# Patient Record
Sex: Female | Born: 2000 | Hispanic: No | Marital: Single | Smoking: Never smoker
Health system: Southern US, Community
[De-identification: ages and names within clinical notes are randomized; demographics above are authoritative.]

## PROBLEM LIST (undated history)

## (undated) ENCOUNTER — Inpatient Hospital Stay (HOSPITAL_COMMUNITY): Payer: Self-pay

## (undated) DIAGNOSIS — S83519A Sprain of anterior cruciate ligament of unspecified knee, initial encounter: Secondary | ICD-10-CM

## (undated) DIAGNOSIS — S83289A Other tear of lateral meniscus, current injury, unspecified knee, initial encounter: Secondary | ICD-10-CM

## (undated) DIAGNOSIS — R0989 Other specified symptoms and signs involving the circulatory and respiratory systems: Secondary | ICD-10-CM

## (undated) DIAGNOSIS — S83249A Other tear of medial meniscus, current injury, unspecified knee, initial encounter: Secondary | ICD-10-CM

## (undated) DIAGNOSIS — J45909 Unspecified asthma, uncomplicated: Secondary | ICD-10-CM

## (undated) HISTORY — DX: Unspecified asthma, uncomplicated: J45.909

---
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
## 2018-01-10 ENCOUNTER — Emergency Department (HOSPITAL_COMMUNITY)
Admission: EM | Admit: 2018-01-10 | Discharge: 2018-01-10 | Discharge status: Against Medical Advice | Payer: Medicaid Other | Attending: Emergency Medicine | Admitting: Emergency Medicine

## 2018-01-10 ENCOUNTER — Encounter (HOSPITAL_COMMUNITY): Payer: Self-pay | Admitting: Emergency Medicine

## 2018-01-10 ENCOUNTER — Other Ambulatory Visit: Payer: Self-pay

## 2018-01-10 DIAGNOSIS — J45909 Unspecified asthma, uncomplicated: Secondary | ICD-10-CM | POA: Insufficient documentation

## 2018-01-10 DIAGNOSIS — Z79899 Other long term (current) drug therapy: Secondary | ICD-10-CM | POA: Insufficient documentation

## 2018-01-10 DIAGNOSIS — R1030 Lower abdominal pain, unspecified: Secondary | ICD-10-CM

## 2018-01-10 DIAGNOSIS — R1032 Left lower quadrant pain: Secondary | ICD-10-CM | POA: Insufficient documentation

## 2018-01-10 LAB — URINALYSIS, ROUTINE W REFLEX MICROSCOPIC
Bilirubin Urine: NEGATIVE
Glucose, UA: NEGATIVE mg/dL
Hgb urine dipstick: NEGATIVE
Ketones, ur: NEGATIVE mg/dL
Leukocytes, UA: NEGATIVE
Nitrite: NEGATIVE
Protein, ur: NEGATIVE mg/dL
Specific Gravity, Urine: 1.01 (ref 1.005–1.030)
pH: 8 (ref 5.0–8.0)

## 2018-01-10 LAB — CBC
HCT: 41.5 % (ref 36.0–46.0)
Hemoglobin: 13.8 g/dL (ref 12.0–15.0)
MCH: 28.2 pg (ref 26.0–34.0)
MCHC: 33.3 g/dL (ref 30.0–36.0)
MCV: 84.7 fL (ref 78.0–100.0)
Platelets: 355 10*3/uL (ref 150–400)
RBC: 4.9 MIL/uL (ref 3.87–5.11)
RDW: 13.4 % (ref 11.5–15.5)
WBC: 11.8 10*3/uL — ABNORMAL HIGH (ref 4.0–10.5)

## 2018-01-10 LAB — COMPREHENSIVE METABOLIC PANEL
ALT: 19 U/L (ref 0–44)
AST: 16 U/L (ref 15–41)
Albumin: 4.2 g/dL (ref 3.5–5.0)
Alkaline Phosphatase: 82 U/L (ref 38–126)
Anion gap: 8 (ref 5–15)
BUN: 8 mg/dL (ref 6–20)
CO2: 27 mmol/L (ref 22–32)
Calcium: 9.4 mg/dL (ref 8.9–10.3)
Chloride: 104 mmol/L (ref 98–111)
Creatinine, Ser: 0.64 mg/dL (ref 0.44–1.00)
GFR calc Af Amer: >60 mL/min (ref >60)
GFR calc non Af Amer: >60 mL/min (ref >60)
Glucose, Bld: 129 mg/dL — ABNORMAL HIGH (ref 70–99)
Potassium: 4 mmol/L (ref 3.5–5.1)
Sodium: 139 mmol/L (ref 135–145)
Total Bilirubin: 0.6 mg/dL (ref 0.3–1.2)
Total Protein: 8 g/dL (ref 6.5–8.1)

## 2018-01-10 LAB — LIPASE, BLOOD: Lipase: 26 U/L (ref 11–51)

## 2018-01-10 LAB — RAPID HIV SCREEN (HIV 1/2 AB+AG)
HIV 1/2 Antibodies: NONREACTIVE
HIV-1 P24 Antigen - HIV24: NONREACTIVE

## 2018-01-10 LAB — PREGNANCY, URINE: Preg Test, Ur: NEGATIVE

## 2018-01-10 NOTE — ED Triage Notes (Signed)
Pt with mid lower abd pain for past few weeks, denies burning on urination, denies N/V/D

## 2018-01-10 NOTE — ED Notes (Signed)
Pt stated she was leaving because she had to be at work by 10:00.

## 2018-01-15 NOTE — ED Provider Notes (Signed)
[facility] EMERGENCY DEPARTMENT Provider Note   CSN: [id number] Arrival date & time: 01/10/18  1732     History   Chief Complaint Chief Complaint  Patient presents with  . Abdominal Pain    HPI Shelly Mckenzie is a 17 y.o. female.  HPI   17 year old female with lower abdominal pain.  She has had intermittent mid to left lower quadrant pain for the past several weeks.  Denies any other associated symptoms.  No dysuria.  No nausea, vomiting or diarrhea.  No unusual vaginal bleeding or discharge.  No fevers or chills.  She is sexually active.  Past Medical History:  Diagnosis Date  . ACL (anterior cruciate ligament) tear 10/21/2014   right  . Asthma   . Lateral meniscal tear 10/21/2014   right knee  . Medial meniscus tear 10/21/2014   right  . Runny nose 10/31/2014   clear drainage, per pt.    Patient Active Problem List   Diagnosis Date Noted  . Nexplanon insertion 08/17/2016  . ACL (anterior cruciate ligament) tear 10/21/2014  . Lateral meniscal tear 10/21/2014  . Medial meniscus tear 10/21/2014    Past Surgical History:  Procedure Laterality Date  . FOREIGN BODY REMOVAL ESOPHAGEAL  10/26/2001  . KNEE ARTHROSCOPY WITH ANTERIOR CRUCIATE LIGAMENT (ACL) REPAIR WITH HAMSTRING GRAFT Right 11/01/2014   Procedure: RIGHT KNEE ARTHROSCOPY ,  LATERAL MENISCECTOMY, ANTERIOR CRUCIATE LIGAMENT (ACL), AUTOGRAFT HAMSTRING;  Surgeon: Salvatore Marvel, MD;  Location: [facility];  Service: Orthopedics;  Laterality: Right;  . KNEE ARTHROSCOPY WITH LATERAL MENISECTOMY Right 11/01/2014   Procedure: KNEE ARTHROSCOPY WITH LATERAL MENISECTOMY;  Surgeon: Salvatore Marvel, MD;  Location: [facility];  Service: Orthopedics;  Laterality: Right;     OB History    Gravida  0   Para  0   Term  0   Preterm  0   AB  0   Living  0     SAB  0   TAB  0   Ectopic  0   Multiple  0   Live Births               Home Medications    Prior to Admission medications    Medication Sig Start Date End Date Taking? Authorizing Provider  acetaminophen (TYLENOL) 500 MG tablet Take 1,000 mg by mouth every 6 (six) hours as needed for moderate pain.   Yes [provider]  etonogestrel (NEXPLANON) 68 MG IMPL implant 1 each by Subdermal route once.   Yes [provider]    Family History Family History  Problem Relation Age of Onset  . Asthma Sister     Social History Social History   Tobacco Use  . Smoking status: Never Smoker  . Smokeless tobacco: Never Used  Substance Use Topics  . Alcohol use: No  . Drug use: No     Allergies   Patient has no known allergies.   Review of Systems Review of Systems  All systems reviewed and negative, other than as noted in HPI.  Physical Exam Updated Vital Signs BP 132/76 (BP Location: Right Arm)   Pulse (!) 111   Temp 99 F (37.2 C) (Oral)   Resp 18   Ht 5' (1.524 m)   Wt 78 kg (172 lb)   LMP 11/28/2017 Comment: spotting last week  SpO2 99%   BMI 33.59 kg/m   Physical Exam  Constitutional: She appears well-developed and well-nourished. No distress.  HENT:  Head:  Normocephalic and atraumatic.  Eyes: Conjunctivae are normal. Right eye exhibits no discharge. Left eye exhibits no discharge.  Neck: Neck supple.  Cardiovascular: Normal rate, regular rhythm and normal heart sounds. Exam reveals no gallop and no friction rub.  No murmur heard. Pulmonary/Chest: Effort normal and breath sounds normal. No respiratory distress.  Abdominal: Soft. She exhibits no distension. There is tenderness.  Mild suprapubic tenderness without rebound or guarding.  No distention.  Musculoskeletal: She exhibits no edema or tenderness.  Neurological: She is alert.  Skin: Skin is warm and dry.  Psychiatric: She has a normal mood and affect. Her behavior is normal. Thought content normal.  Nursing note and vitals reviewed.    ED Treatments / Results  Labs (all labs ordered are listed, but only  abnormal results are displayed) Labs Reviewed  COMPREHENSIVE METABOLIC PANEL - Abnormal; Notable for the following components:      Result Value   Glucose, Bld 129 (*)    All other components within normal limits  CBC - Abnormal; Notable for the following components:   WBC 11.8 (*)    All other components within normal limits  URINALYSIS, ROUTINE W REFLEX MICROSCOPIC - Abnormal; Notable for the following components:   APPearance HAZY (*)    All other components within normal limits  WET PREP, GENITAL  LIPASE, BLOOD  PREGNANCY, URINE  RAPID HIV SCREEN (HIV 1/2 AB+AG)  GC/CHLAMYDIA PROBE AMP ([place]) NOT AT [facility]    EKG None  Radiology No results found.  Procedures Procedures (including critical care time)  Medications Ordered in ED Medications - No data to display   Initial Impression / Assessment and Plan / ED Course  I have reviewed the triage vital signs and the nursing notes.  Pertinent labs & imaging results that were available during my care of the patient were reviewed by me and considered in my medical decision making (see chart for details).    17 year old female with lower abdominal pain.  She does have some mild suprapubic tenderness on exam.  UA looks fine.  Patient eloped from the emergency room prior to pelvic examination citing the need to get to work.   Final Clinical Impressions(s) / ED Diagnoses   Final diagnoses:  Lower abdominal pain    ED Discharge Orders    None       Raeford RazorKohut, Bettey Muraoka, MD 01/15/18 [phone]

## 2019-05-27 ENCOUNTER — Emergency Department (HOSPITAL_COMMUNITY)
Admission: EM | Admit: 2019-05-27 | Discharge: 2019-05-27 | Disposition: A | Discharge status: Home / Self Care | Payer: Self-pay | Attending: Emergency Medicine | Admitting: Emergency Medicine

## 2019-05-27 ENCOUNTER — Encounter (HOSPITAL_COMMUNITY): Payer: Self-pay | Admitting: Emergency Medicine

## 2019-05-27 ENCOUNTER — Other Ambulatory Visit: Payer: Self-pay

## 2019-05-27 DIAGNOSIS — J4521 Mild intermittent asthma with (acute) exacerbation: Secondary | ICD-10-CM | POA: Insufficient documentation

## 2019-05-27 DIAGNOSIS — Z79899 Other long term (current) drug therapy: Secondary | ICD-10-CM | POA: Insufficient documentation

## 2019-05-27 MED ORDER — AEROCHAMBER Z-STAT PLUS/MEDIUM MISC
1.0000 | Freq: Once | Status: AC
  Administered 2019-05-27: 1
  Filled 2019-05-27: qty 1

## 2019-05-27 MED ORDER — ALBUTEROL SULFATE HFA 108 (90 BASE) MCG/ACT IN AERS
2.0000 | INHALATION_SPRAY | RESPIRATORY_TRACT | Status: AC
  Administered 2019-05-27: 2 via RESPIRATORY_TRACT
  Filled 2019-05-27: qty 6.7

## 2019-05-27 MED ORDER — PREDNISONE 20 MG PO TABS: 40.0000 mg | ORAL_TABLET | Freq: Every day | ORAL | 0 refills | Status: DC

## 2019-05-27 MED ORDER — ALBUTEROL SULFATE HFA 108 (90 BASE) MCG/ACT IN AERS: 2.0000 | INHALATION_SPRAY | RESPIRATORY_TRACT | 3 refills | Status: DC | PRN

## 2019-05-27 NOTE — ED Provider Notes (Addendum)
[facility] EMERGENCY DEPARTMENT Provider Note   CSN: [id number] Arrival date & time: 05/27/19  2542     History   Chief Complaint Chief Complaint  Patient presents with  . Shortness of Breath    HPI Shelly Mckenzie is a 18 y.o. female.     HPI  This patient is a 18 year old female with a history of asthma, presents with a complaint of shortness of breath that started approximately 30 minutes ago when she woke up.  Her albuterol inhaler was not helping, she has not been having any coughing, chest pain, fever, chills, nausea, vomiting, abdominal pain, diarrhea or any other complaints.  No sore throat, no runny nose, no sick contacts.  Symptoms are persistent, the patient was able to ambulate to the back without any difficulty  Past Medical History:  Diagnosis Date  . ACL (anterior cruciate ligament) tear 10/21/2014   right  . Asthma   . Lateral meniscal tear 10/21/2014   right knee  . Medial meniscus tear 10/21/2014   right  . Runny nose 10/31/2014   clear drainage, per pt.    Patient Active Problem List   Diagnosis Date Noted  . Nexplanon insertion 08/17/2016  . ACL (anterior cruciate ligament) tear 10/21/2014  . Lateral meniscal tear 10/21/2014  . Medial meniscus tear 10/21/2014    Past Surgical History:  Procedure Laterality Date  . FOREIGN BODY REMOVAL ESOPHAGEAL  10/26/2001  . KNEE ARTHROSCOPY WITH ANTERIOR CRUCIATE LIGAMENT (ACL) REPAIR WITH HAMSTRING GRAFT Right 11/01/2014   Procedure: RIGHT KNEE ARTHROSCOPY ,  LATERAL MENISCECTOMY, ANTERIOR CRUCIATE LIGAMENT (ACL), AUTOGRAFT HAMSTRING;  Surgeon: Elsie Saas, MD;  Location: [place];  Service: Orthopedics;  Laterality: Right;  . KNEE ARTHROSCOPY WITH LATERAL MENISECTOMY Right 11/01/2014   Procedure: KNEE ARTHROSCOPY WITH LATERAL MENISECTOMY;  Surgeon: Elsie Saas, MD;  Location: [place];  Service: Orthopedics;  Laterality: Right;     OB History    Gravida  0   Para  0   Term   0   Preterm  0   AB  0   Living  0     SAB  0   TAB  0   Ectopic  0   Multiple  0   Live Births               Home Medications    Prior to Admission medications   Medication Sig Start Date End Date Taking? Authorizing Provider  acetaminophen (TYLENOL) 500 MG tablet Take 1,000 mg by mouth every 6 (six) hours as needed for moderate pain.    [provider]  albuterol (VENTOLIN HFA) 108 (90 Base) MCG/ACT inhaler Inhale 2 puffs into the lungs every 4 (four) hours as needed for wheezing or shortness of breath. 05/27/19   Noemi Chapel, MD  etonogestrel (NEXPLANON) 68 MG IMPL implant 1 each by Subdermal route once.    [provider]  predniSONE (DELTASONE) 20 MG tablet Take 2 tablets (40 mg total) by mouth daily. 05/27/19   Noemi Chapel, MD    Family History Family History  Problem Relation Age of Onset  . Asthma Sister     Social History Social History   Tobacco Use  . Smoking status: Never Smoker  . Smokeless tobacco: Never Used  Substance Use Topics  . Alcohol use: No  . Drug use: No     Allergies   Patient has no known allergies.   Review of Systems Review of Systems  All  other systems reviewed and are negative.    Physical Exam Updated Vital Signs BP 138/88   Pulse (!) 112   Temp 98.8 F (37.1 C) (Oral)   Resp 18   Ht 1.626 m ( )   Wt 77.1 kg   SpO2 97%   BMI 29.18 kg/m   Physical Exam Vitals signs and nursing note reviewed.  Constitutional:      General: She is not in acute distress.    Appearance: She is well-developed.  HENT:     Head: Normocephalic and atraumatic.     Mouth/Throat:     Pharynx: No oropharyngeal exudate.  Eyes:     General: No scleral icterus.       Right eye: No discharge.        Left eye: No discharge.     Conjunctiva/sclera: Conjunctivae normal.     Pupils: Pupils are equal, round, and reactive to light.  Neck:     Musculoskeletal: Normal range of motion and neck supple.      Thyroid: No thyromegaly.     Vascular: No JVD.  Cardiovascular:     Rate and Rhythm: Regular rhythm. Tachycardia present.     Heart sounds: Normal heart sounds. No murmur. No friction rub. No gallop.      Comments: Pulse of 105 Pulmonary:     Effort: Pulmonary effort is normal. No respiratory distress.     Breath sounds: Wheezing present. No rales.  Abdominal:     General: Bowel sounds are normal. There is no distension.     Palpations: Abdomen is soft. There is no mass.     Tenderness: There is no abdominal tenderness.  Musculoskeletal: Normal range of motion.        General: No tenderness.  Lymphadenopathy:     Cervical: No cervical adenopathy.  Skin:    General: Skin is warm and dry.     Findings: No erythema or rash.  Neurological:     Mental Status: She is alert.     Coordination: Coordination normal.  Psychiatric:        Behavior: Behavior normal.      ED Treatments / Results  Labs (all labs ordered are listed, but only abnormal results are displayed) Labs Reviewed - No data to display  EKG None  Radiology No results found.  Procedures Procedures (including critical care time)  Medications Ordered in ED Medications  albuterol (VENTOLIN HFA) 108 (90 Base) MCG/ACT inhaler 2 puff (has no administration in time range)  aerochamber Z-Stat Plus/medium 1 each (has no administration in time range)     Initial Impression / Assessment and Plan / ED Course  I have reviewed the triage vital signs and the nursing notes.  Pertinent labs & imaging results that were available during my care of the patient were reviewed by me and considered in my medical decision making (see chart for details).        The patient is able to speak in full sentences, she has mild to moderate expiratory wheezing, there is no increased work of breathing, she is able to ambulate without difficulty, she is not hypoxic with an oxygen of 98%, she was given albuterol inhaler with 2 puffs, a  spacer and discharged home with this in her hand.  She was given a prescription for prednisone and refills for her inhaler.  I do not think she has coronavirus, she does not have a pulmonary embolism, less likely to be related to pneumonia given her normal lung  sounds lack of fever and classic wheezing with tightness in the chest.  She is stable for discharge and agreeable to return should symptoms worsen.  Final Clinical Impressions(s) / ED Diagnoses   Final diagnoses:  Mild intermittent asthma with exacerbation    ED Discharge Orders         Ordered    predniSONE (DELTASONE) 20 MG tablet  Daily     05/27/19 0635    albuterol (VENTOLIN HFA) 108 (90 Base) MCG/ACT inhaler  Every 4 hours PRN     05/27/19 0092           Eber Hong, MD 05/27/19 3300    Eber Hong, MD 05/27/19 [phone]

## 2019-05-27 NOTE — ED Triage Notes (Signed)
Pt c/o SOB that started x 30 minutes ago, pt states "I just need a breathing treatment. I ran out of my albuterol and my inhaler not working." Pt has hx of asthma.

## 2019-05-27 NOTE — Discharge Instructions (Signed)
If you do not have a doctor see the list below  Albuterol, 2 puffs every 4 hours as needed  Prednisone 40 mg daily for 5 days  Emergency department for severe or worsening symptoms.  [facility] Primary Care Doctor List    Sinda Du MD. Specialty: Pulmonary Disease Contact information: [place]   [place] [address]  [phone]   Tula Nakayama, MD. Specialty: [facility] Medicine Contact information: [address], [address] [address]  [phone]   Sallee Lange, MD. Specialty: [facility] Medicine Contact information: [place]  [place] [address]  [phone]   Rosita Fire, MD Specialty: Internal Medicine Contact information: [place] [address]  [phone]   Delphina Cahill, MD. Specialty: Internal Medicine Contact information: [place] [address]  [phone]    [facility] (Dr. Maudie Mercury) Specialty: Family Medicine Contact information: [place] [address]  [phone]   Leslie Andrea, MD. Specialty: [facility] Medicine Contact information: [place] [address]  [phone]   Asencion Noble, MD. Specialty: Internal Medicine Contact information: [address] [address]  [place] [address]  [place]  [address] [place], [place] [address] [phone]  Services [facility] offers a variety of basic health services.  Services include but are not limited to: Blood pressure checks  Heart rate checks  Blood sugar checks  Urine analysis  Rapid strep tests  Pregnancy tests.  Health education and referrals  People needing more complex services will be directed to a physician online. Using these virtual visits, doctors can evaluate and prescribe medicine and treatments. There will be no medication on-site,  though [facility] will help patients fill their prescriptions at little to no cost.   For More information please go to: [email or web address]

## 2019-08-22 ENCOUNTER — Telehealth: Payer: Self-pay | Admitting: Advanced Practice Midwife

## 2019-08-22 NOTE — Telephone Encounter (Signed)
Tried to reach the patient to remind her of her appointment/restrictions, mailbox not setup. °

## 2019-08-23 ENCOUNTER — Encounter: Payer: Self-pay | Admitting: Advanced Practice Midwife

## 2019-09-06 ENCOUNTER — Other Ambulatory Visit: Payer: Self-pay

## 2019-09-06 ENCOUNTER — Ambulatory Visit: Payer: Self-pay | Attending: Internal Medicine

## 2019-09-06 DIAGNOSIS — Z20822 Contact with and (suspected) exposure to covid-19: Secondary | ICD-10-CM | POA: Insufficient documentation

## 2019-09-07 LAB — NOVEL CORONAVIRUS, NAA: SARS-CoV-2, NAA: NOT DETECTED

## 2020-07-18 ENCOUNTER — Encounter (HOSPITAL_COMMUNITY): Payer: Self-pay | Admitting: *Deleted

## 2020-07-18 ENCOUNTER — Other Ambulatory Visit: Payer: Self-pay

## 2020-07-18 DIAGNOSIS — J45909 Unspecified asthma, uncomplicated: Secondary | ICD-10-CM | POA: Insufficient documentation

## 2020-07-18 DIAGNOSIS — J020 Streptococcal pharyngitis: Secondary | ICD-10-CM | POA: Insufficient documentation

## 2020-07-18 LAB — GROUP A STREP BY PCR: Group A Strep by PCR: DETECTED — AB

## 2020-07-18 NOTE — ED Triage Notes (Signed)
Pt with sore throat since yesterday, pt with enlarged tonsils and red.  Strep sent from triage. Low grade fever in triage as well.

## 2020-07-19 ENCOUNTER — Emergency Department (HOSPITAL_COMMUNITY)
Admission: EM | Admit: 2020-07-19 | Discharge: 2020-07-19 | Disposition: A | Discharge status: Home / Self Care | Payer: Self-pay | Attending: Emergency Medicine | Admitting: Emergency Medicine

## 2020-07-19 DIAGNOSIS — J020 Streptococcal pharyngitis: Secondary | ICD-10-CM

## 2020-07-19 MED ORDER — PENICILLIN G BENZATHINE 1200000 UNIT/2ML IM SUSP
1.2000 10*6.[IU] | Freq: Once | INTRAMUSCULAR | Status: AC
  Administered 2020-07-19: 1.2 10*6.[IU] via INTRAMUSCULAR
  Filled 2020-07-19: qty 2

## 2020-07-19 MED ORDER — DEXAMETHASONE 4 MG PO TABS
10.0000 mg | ORAL_TABLET | Freq: Once | ORAL | Status: AC
  Administered 2020-07-19: 10 mg via ORAL
  Filled 2020-07-19: qty 3

## 2020-07-19 NOTE — ED Provider Notes (Signed)
[facility] EMERGENCY DEPARTMENT Provider Note   CSN: [id number] Arrival date & time: 07/18/20  2145     History Chief Complaint  Patient presents with  . Sore Throat    Shelly Mckenzie is a 20 y.o. female.  The history is provided by the patient.  Sore Throat  She comes in complaining of a sore throat which started yesterday.  Swallowing.  She denies any fever, chills, sweats.  There has been no cough.  She denies arthralgias or myalgias.  There has been no sick contacts.  Past Medical History:  Diagnosis Date  . ACL (anterior cruciate ligament) tear 10/21/2014   right  . Asthma   . Lateral meniscal tear 10/21/2014   right knee  . Medial meniscus tear 10/21/2014   right  . Runny nose 10/31/2014   clear drainage, per pt.    Patient Active Problem List   Diagnosis Date Noted  . Nexplanon insertion 08/17/2016  . ACL (anterior cruciate ligament) tear 10/21/2014  . Lateral meniscal tear 10/21/2014  . Medial meniscus tear 10/21/2014    Past Surgical History:  Procedure Laterality Date  . FOREIGN BODY REMOVAL ESOPHAGEAL  10/26/2001  . KNEE ARTHROSCOPY WITH ANTERIOR CRUCIATE LIGAMENT (ACL) REPAIR WITH HAMSTRING GRAFT Right 11/01/2014   Procedure: RIGHT KNEE ARTHROSCOPY ,  LATERAL MENISCECTOMY, ANTERIOR CRUCIATE LIGAMENT (ACL), AUTOGRAFT HAMSTRING;  Surgeon: Salvatore Marvel, MD;  Location: [facility];  Service: Orthopedics;  Laterality: Right;  . KNEE ARTHROSCOPY WITH LATERAL MENISECTOMY Right 11/01/2014   Procedure: KNEE ARTHROSCOPY WITH LATERAL MENISECTOMY;  Surgeon: Salvatore Marvel, MD;  Location: [facility];  Service: Orthopedics;  Laterality: Right;     OB History    Gravida  0   Para  0   Term  0   Preterm  0   AB  0   Living  0     SAB  0   IAB  0   Ectopic  0   Multiple  0   Live Births              Family History  Problem Relation Age of Onset  . Asthma Sister     Social History   Tobacco Use  . Smoking status:  Never Smoker  . Smokeless tobacco: Never Used  Vaping Use  . Vaping Use: Never used  Substance Use Topics  . Alcohol use: No  . Drug use: No    Home Medications Prior to Admission medications   Medication Sig Start Date End Date Taking? Authorizing Provider  acetaminophen (TYLENOL) 500 MG tablet Take 1,000 mg by mouth every 6 (six) hours as needed for moderate pain.    [provider]  albuterol (VENTOLIN HFA) 108 (90 Base) MCG/ACT inhaler Inhale 2 puffs into the lungs every 4 (four) hours as needed for wheezing or shortness of breath. 05/27/19   Eber Hong, MD  etonogestrel (NEXPLANON) 68 MG IMPL implant 1 each by Subdermal route once.    [provider]  predniSONE (DELTASONE) 20 MG tablet Take 2 tablets (40 mg total) by mouth daily. 05/27/19   Eber Hong, MD    Allergies    Patient has no known allergies.  Review of Systems   Review of Systems  All other systems reviewed and are negative.   Physical Exam Updated Vital Signs BP 136/81 (BP Location: Right Arm)   Pulse (!) 126   Temp 99.2 F (37.3 C) (Oral)   Resp 16   Ht 5\' 1"  (  1.549 m)   Wt 86.5 kg   SpO2 99%   BMI 36.05 kg/m   Physical Exam Vitals and nursing note reviewed.   20 year old female, resting comfortably and in no acute distress. Vital signs are significant for rapid heart rate. Oxygen saturation is 99%, which is normal. Head is normocephalic and atraumatic. PERRLA, EOMI. Oropharynx shows moderate tonsillar erythema and slight exudate present.  There is no pooling of secretions.  Phonation is normal. Neck is nontender and supple without adenopathy or JVD. Back is nontender and there is no CVA tenderness. Lungs are clear without rales, wheezes, or rhonchi. Chest is nontender. Heart has regular rate and rhythm without murmur. Abdomen is soft, flat, nontender without masses or hepatosplenomegaly and peristalsis is normoactive. Extremities have no cyanosis or edema, full range of  motion is present. Skin is warm and dry without rash. Neurologic: Mental status is normal, cranial nerves are intact, there are no motor or sensory deficits.  ED Results / Procedures / Treatments   Labs (all labs ordered are listed, but only abnormal results are displayed) Labs Reviewed  GROUP A STREP BY PCR - Abnormal; Notable for the following components:      Result Value   Group A Strep by PCR DETECTED (*)    All other components within normal limits   Procedures Procedures   Medications Ordered in ED Medications  dexamethasone (DECADRON) tablet 10 mg (has no administration in time range)  penicillin g benzathine (BICILLIN LA) 1200000 UNIT/2ML injection 1.2 Million Units (has no administration in time range)    ED Course  I have reviewed the triage vital signs and the nursing notes.  Pertinent lab results that were available during my care of the patient were reviewed by me and considered in my medical decision making (see chart for details).  MDM Rules/Calculators/A&P Sore throat.  Strep screen is positive.  She was given options of oral versus parenteral antibiotics and has requested parenteral antibiotics.  She is given an injection of Bicillin LA.  Also given a dose of dexamethasone.  Old records are reviewed, and she has no relevant past visits.  Final Clinical Impression(s) / ED Diagnoses Final diagnoses:  Strep pharyngitis    Rx / DC Orders ED Discharge Orders    None       Dione Booze, MD 07/19/20 0128

## 2020-07-19 NOTE — ED Notes (Addendum)
No reaction to medication taken place during observation. Pt to be discharged

## 2022-07-05 ENCOUNTER — Ambulatory Visit (INDEPENDENT_AMBULATORY_CARE_PROVIDER_SITE_OTHER): Payer: Medicaid Other | Admitting: Adult Health

## 2022-07-05 ENCOUNTER — Encounter: Payer: Self-pay | Admitting: Adult Health

## 2022-07-05 ENCOUNTER — Other Ambulatory Visit (HOSPITAL_COMMUNITY)
Admission: RE | Admit: 2022-07-05 | Discharge: 2022-07-05 | Disposition: A | Discharge status: Home / Self Care | Payer: Medicaid Other | Source: Ambulatory Visit | Attending: Adult Health | Admitting: Adult Health

## 2022-07-05 VITALS — BP 129/78 | HR 80 | Ht 64.0 in | Wt 194.0 lb

## 2022-07-05 DIAGNOSIS — Z01419 Encounter for gynecological examination (general) (routine) without abnormal findings: Secondary | ICD-10-CM | POA: Diagnosis not present

## 2022-07-05 DIAGNOSIS — Z3009 Encounter for other general counseling and advice on contraception: Secondary | ICD-10-CM | POA: Insufficient documentation

## 2022-07-05 DIAGNOSIS — Z975 Presence of (intrauterine) contraceptive device: Secondary | ICD-10-CM | POA: Insufficient documentation

## 2022-07-05 DIAGNOSIS — Z30011 Encounter for initial prescription of contraceptive pills: Secondary | ICD-10-CM | POA: Insufficient documentation

## 2022-07-05 DIAGNOSIS — Z113 Encounter for screening for infections with a predominantly sexual mode of transmission: Secondary | ICD-10-CM | POA: Insufficient documentation

## 2022-07-05 MED ORDER — LO LOESTRIN FE 1 MG-10 MCG / 10 MCG PO TABS: 1.0000 | ORAL_TABLET | Freq: Every day | ORAL | 11 refills | Status: DC

## 2022-07-05 NOTE — Progress Notes (Signed)
Patient ID: Shelly Mckenzie, female   DOB: October 18, 2000, 22 y.o.   MRN: [id number] History of Present Illness: Shelly Mckenzie is a a 22 year old white female,single, G0P0, in for a well woman gyn exam and first pap. She has expired nexplanon, it was placed 2018. She has [employer].    Current Medications, Allergies, Past Medical History, Past Surgical History, Family History and Social History were reviewed in [employer] record.     Review of Systems: Patient denies any headaches, hearing loss, fatigue, blurred vision, shortness of breath, chest pain, abdominal pain, problems with bowel movements, urination, or intercourse. No joint pain or mood swings.  Periods last 7 days, heavy with some cramps first 3 days, will change tampon every 3 hours    Physical Exam:BP 129/78 (BP Location: Left Arm, Patient Position: Sitting, Cuff Size: Normal)   Pulse 80   Ht 5\' 4"  (1.626 m)   Wt 194 lb (88 kg)   LMP 06/15/2022   BMI 33.30 kg/m  UPT is negative. General:  Well developed, well nourished, no acute distress Skin:  Warm and dry Neck:  Midline trachea, normal thyroid, good ROM, no lymphadenopathy Lungs; Clear to auscultation bilaterally Breast:  No dominant palpable mass, retraction, or nipple discharge,has bilateral nipple rods  Cardiovascular: Regular rate and rhythm Abdomen:  Soft, non tender, no hepatosplenomegaly Pelvic:  External genitalia is normal in appearance, no lesions.  The vagina is normal in appearance. Urethra has no lesions or masses. The cervix is smooth, pap with GC/CHL performed.  Uterus is felt to be normal size, shape, and contour.  No adnexal masses or tenderness noted.Bladder is non tender, no masses felt. Extremities/musculoskeletal:  No swelling or varicosities noted, no clubbing or cyanosis,can palpate nexplanon in left arm  Psych:  No mood changes, alert and cooperative,seems happy AA is 4 Fall risk is low    07/05/2022    2:16 PM   Depression screen PHQ 2/9  Decreased Interest 0  Down, Depressed, Hopeless 0  PHQ - 2 Score 0  Altered sleeping 0  Tired, decreased energy 0  Change in appetite 0  Feeling bad or failure about yourself  0  Trouble concentrating 0  Moving slowly or fidgety/restless 0  Suicidal thoughts 0  PHQ-9 Score 0       07/05/2022    2:16 PM  GAD 7 : Generalized Anxiety Score  Nervous, Anxious, on Edge 0  Control/stop worrying 0  Worry too much - different things 0  Trouble relaxing 0  Restless 0  Easily annoyed or irritable 0  Afraid - awful might happen 0  Total GAD 7 Score 0      Upstream - 07/05/22 1415       Pregnancy Intention Screening   Does the patient want to become pregnant in the next year? No    Does the patient's partner want to become pregnant in the next year? No    Would the patient like to discuss contraceptive options today? No      Contraception Wrap Up   Current Method Hormonal Implant;Female Condom   it is expired   End Method Hormonal Implant;Oral Contraceptive;Female Condom   will get appt for removal of nexplanon, has [place]   Contraception Counseling Provided Yes    How was the end contraceptive method provided? Prescription            Examination chaperoned by Celene Squibb LPN   Impression and Plan: 1. Encounter for gynecological examination  with Papanicolaou smear of cervix Pap sent  Pap in 3 years if normal Physical in 1 year   2. Screen for STD (sexually transmitted disease) GC/CHL on pap Check HIV,RPR, hepatitis B &C   3. Encounter for initial prescription of contraceptive pills Denies MI,stroke,DVT or breast cancer, or migraine with aura,will rx lo Loestrin, to start today.  Meds ordered this encounter  Medications   Norethindrone-Ethinyl Estradiol-Fe Biphas (LO LOESTRIN FE) 1 MG-10 MCG / 10 MCG tablet    Sig: Take 1 tablet by mouth daily. Take 1 daily by mouth    Dispense:  28 tablet    Refill:  11    BIN [id number], PCN CN, GRP  [id number]    Order Specific Question:   Supervising Provider    Answer:   Florian Buff [[id number]]   Use condoms for 1 pack  at least  Follow up in 3 months for ROS  4. Nexplanon in place Was place in 2018 left arm   5. Family planning

## 2022-07-06 LAB — HIV ANTIBODY (ROUTINE TESTING W REFLEX): HIV Screen 4th Generation wRfx: NONREACTIVE

## 2022-07-06 LAB — HEPATITIS C ANTIBODY: Hep C Virus Ab: NONREACTIVE

## 2022-07-06 LAB — HEPATITIS B SURFACE ANTIGEN: Hepatitis B Surface Ag: NEGATIVE

## 2022-07-06 LAB — RPR: RPR Ser Ql: NONREACTIVE

## 2022-07-07 LAB — CYTOLOGY - PAP
Chlamydia: NEGATIVE
Comment: NEGATIVE
Comment: NORMAL
Diagnosis: NEGATIVE
Neisseria Gonorrhea: NEGATIVE

## 2022-07-08 ENCOUNTER — Other Ambulatory Visit: Payer: Self-pay | Admitting: Adult Health

## 2022-07-08 MED ORDER — METRONIDAZOLE 500 MG PO TABS: 500.0000 mg | ORAL_TABLET | Freq: Two times a day (BID) | ORAL | 0 refills | Status: DC

## 2022-07-13 ENCOUNTER — Encounter: Payer: Self-pay | Admitting: Adult Health

## 2022-07-13 ENCOUNTER — Ambulatory Visit (INDEPENDENT_AMBULATORY_CARE_PROVIDER_SITE_OTHER): Payer: Medicaid Other | Admitting: Adult Health

## 2022-07-13 VITALS — BP 126/80 | HR 82 | Ht 60.0 in | Wt 194.6 lb

## 2022-07-13 DIAGNOSIS — Z3046 Encounter for surveillance of implantable subdermal contraceptive: Secondary | ICD-10-CM | POA: Diagnosis not present

## 2022-07-13 NOTE — Progress Notes (Signed)
  Subjective:     Patient ID: Shelly Mckenzie, female   DOB: 07/13/2000, 22 y.o.   MRN: [id number]  HPI Shelly Mckenzie is a 22 year old white female,single, G0P0 in for nexplanon removal, she started lo Loestrin last week.  Last pap was negative malignancy and GC/CHL 07/05/22.  Review of Systems For nexplanon removal Reviewed past medical,surgical, social and family history. Reviewed medications and allergies.     Objective:   Physical Exam BP 126/80 (BP Location: Left Arm, Patient Position: Sitting, Cuff Size: Normal)   Pulse 82   Ht 5' (1.524 m)   Wt 194 lb 9.6 oz (88.3 kg)   LMP 06/15/2022   BMI 38.01 kg/m  Consent signed and time out called.   Left arm cleansed with betadine, and injected with 1.5 cc 2% lidocaine and waited til numb.Under sterile technique a #11 blade was used to make small vertical incision, and a curved forceps was used to easily remove rod. Steri strips applied. Pressure dressing applied.  Fall risk is low  Upstream - 07/13/22 1059       Pregnancy Intention Screening   Does the patient want to become pregnant in the next year? No    Does the patient's partner want to become pregnant in the next year? No    Would the patient like to discuss contraceptive options today? No      Contraception Wrap Up   Current Method Oral Contraceptive    End Method Oral Contraceptive             Assessment:      1. Encounter for Nexplanon removal Use condoms, keep clean and dry x 24 hours, no heavy lifting, keep steri strips on x 72 hours, Keep pressure dressing on x 24 hours. Follow up prn problems.     Plan:     Follow up in 3 months for ROS on lo Loestrin and BP check

## 2022-07-13 NOTE — Patient Instructions (Signed)
Use condoms, keep clean and dry x 24 hours, no heavy lifting, keep steri strips on x 72 hours, Keep pressure dressing on x 24 hours. Follow up prn problems.  

## 2022-10-04 ENCOUNTER — Ambulatory Visit: Payer: Medicaid Other | Admitting: Adult Health

## 2022-10-12 ENCOUNTER — Ambulatory Visit: Payer: BC Managed Care – PPO | Admitting: Adult Health

## 2023-02-01 ENCOUNTER — Ambulatory Visit (INDEPENDENT_AMBULATORY_CARE_PROVIDER_SITE_OTHER): Payer: Medicaid Other | Admitting: *Deleted

## 2023-02-01 VITALS — BP 130/84 | HR 71 | Ht 61.0 in | Wt 200.0 lb

## 2023-02-01 DIAGNOSIS — Z3201 Encounter for pregnancy test, result positive: Secondary | ICD-10-CM | POA: Diagnosis not present

## 2023-02-01 LAB — POCT URINE PREGNANCY: Preg Test, Ur: POSITIVE — AB

## 2023-02-01 NOTE — Progress Notes (Signed)
   NURSE VISIT- PREGNANCY CONFIRMATION   SUBJECTIVE:  Shelly Mckenzie is a 22 y.o. G1P0000 female at [redacted]w[redacted]d by certain LMP of Patient's last menstrual period was 12/27/2022 (exact date). Here for pregnancy confirmation.  Home pregnancy test: positive x 2   She reports no complaints.  She is not taking prenatal vitamins.    OBJECTIVE:  BP 130/84 (BP Location: Right Arm, Patient Position: Sitting, Cuff Size: Normal)   Pulse 71   Ht 5\' 1"  (1.549 m)   Wt 200 lb (90.7 kg)   LMP 12/27/2022 (Exact Date)   BMI 37.79 kg/m   Appears well, in no apparent distress  Results for orders placed or performed in visit on 02/01/23 (from the past 24 hour(s))  POCT urine pregnancy   Collection Time: 02/01/23 10:36 AM  Result Value Ref Range   Preg Test, Ur Positive (A) Negative    ASSESSMENT: Positive pregnancy test, [redacted]w[redacted]d by LMP    PLAN: Schedule for dating ultrasound in 3 weeks Prenatal vitamins: plans to begin OTC ASAP   Nausea medicines: not currently needed   OB packet given: Yes  Annamarie Dawley  02/01/2023 10:40 AM

## 2023-02-23 ENCOUNTER — Other Ambulatory Visit: Payer: Self-pay | Admitting: Obstetrics & Gynecology

## 2023-02-23 DIAGNOSIS — O3680X0 Pregnancy with inconclusive fetal viability, not applicable or unspecified: Secondary | ICD-10-CM

## 2023-02-24 ENCOUNTER — Ambulatory Visit (INDEPENDENT_AMBULATORY_CARE_PROVIDER_SITE_OTHER): Payer: Medicaid Other

## 2023-02-24 DIAGNOSIS — Z3481 Encounter for supervision of other normal pregnancy, first trimester: Secondary | ICD-10-CM | POA: Diagnosis not present

## 2023-02-24 DIAGNOSIS — Z3A08 8 weeks gestation of pregnancy: Secondary | ICD-10-CM

## 2023-02-24 DIAGNOSIS — O3680X0 Pregnancy with inconclusive fetal viability, not applicable or unspecified: Secondary | ICD-10-CM

## 2023-02-24 NOTE — Progress Notes (Signed)
[place] 8+3 wks,single IUP with yolk sac,FHR 171 bpm,normal ovaries,CRL 19.91 mm

## 2023-03-18 ENCOUNTER — Encounter: Payer: Self-pay | Admitting: Women's Health

## 2023-03-18 DIAGNOSIS — O0990 Supervision of high risk pregnancy, unspecified, unspecified trimester: Secondary | ICD-10-CM | POA: Insufficient documentation

## 2023-03-18 DIAGNOSIS — Z3400 Encounter for supervision of normal first pregnancy, unspecified trimester: Secondary | ICD-10-CM | POA: Insufficient documentation

## 2023-03-22 ENCOUNTER — Ambulatory Visit (INDEPENDENT_AMBULATORY_CARE_PROVIDER_SITE_OTHER): Payer: Medicaid Other

## 2023-03-22 ENCOUNTER — Encounter: Payer: Self-pay | Admitting: Women's Health

## 2023-03-22 ENCOUNTER — Encounter: Payer: Medicaid Other | Admitting: *Deleted

## 2023-03-22 ENCOUNTER — Ambulatory Visit (INDEPENDENT_AMBULATORY_CARE_PROVIDER_SITE_OTHER): Payer: Medicaid Other | Admitting: Women's Health

## 2023-03-22 ENCOUNTER — Other Ambulatory Visit: Payer: Self-pay | Admitting: Obstetrics & Gynecology

## 2023-03-22 VITALS — BP 124/79 | HR 80 | Wt 193.6 lb

## 2023-03-22 DIAGNOSIS — Z3A12 12 weeks gestation of pregnancy: Secondary | ICD-10-CM

## 2023-03-22 DIAGNOSIS — R8271 Bacteriuria: Secondary | ICD-10-CM

## 2023-03-22 DIAGNOSIS — Z3682 Encounter for antenatal screening for nuchal translucency: Secondary | ICD-10-CM

## 2023-03-22 DIAGNOSIS — Z3402 Encounter for supervision of normal first pregnancy, second trimester: Secondary | ICD-10-CM

## 2023-03-22 DIAGNOSIS — Z3401 Encounter for supervision of normal first pregnancy, first trimester: Secondary | ICD-10-CM

## 2023-03-22 MED ORDER — ASPIRIN 81 MG PO TBEC: 81.0000 mg | DELAYED_RELEASE_TABLET | Freq: Every day | ORAL | 3 refills | Status: DC

## 2023-03-22 MED ORDER — BLOOD PRESSURE MONITOR MISC: 0 refills | Status: DC

## 2023-03-22 NOTE — Progress Notes (Signed)
[place] 12+1 wks,measurements c/w dates,CRL 64.03 mm,normal ovaries,FHR 159 bpm,NT 1.6 mm,NB present

## 2023-03-22 NOTE — Progress Notes (Signed)
INITIAL OBSTETRICAL VISIT Patient name: Shelly Mckenzie MRN [id number]  Date of birth: [birth year] Chief Complaint:   Initial Prenatal Visit  History of Present Illness:   Shelly Mckenzie is a 22 y.o. G37P0000 Caucasian female at [redacted]w[redacted]d by LMP c/w u/s at 8 weeks with an Estimated Date of Delivery: 10/03/23 being seen today for her initial obstetrical visit.   Patient's last menstrual period was 12/27/2022 (exact date). Her obstetrical history is significant for primigravida.   Today she reports nausea, improving, declines meds Last pap 07/05/22. Results were: NILM w/ HRHPV not done     03/22/2023   10:14 AM 07/05/2022    2:16 PM  Depression screen PHQ 2/9  Decreased Interest 0 0  Down, Depressed, Hopeless 0 0  PHQ - 2 Score 0 0  Altered sleeping 0 0  Tired, decreased energy 1 0  Change in appetite 0 0  Feeling bad or failure about yourself  0 0  Trouble concentrating 0 0  Moving slowly or fidgety/restless 0 0  Suicidal thoughts 0 0  PHQ-9 Score 1 0        03/22/2023   10:14 AM 07/05/2022    2:16 PM  GAD 7 : Generalized Anxiety Score  Nervous, Anxious, on Edge 0 0  Control/stop worrying 0 0  Worry too much - different things 0 0  Trouble relaxing 0 0  Restless 0 0  Easily annoyed or irritable 0 0  Afraid - awful might happen 0 0  Total GAD 7 Score 0 0     Review of Systems:   Pertinent items are noted in HPI Denies cramping/contractions, leakage of fluid, vaginal bleeding, abnormal vaginal discharge w/ itching/odor/irritation, headaches, visual changes, shortness of breath, chest pain, abdominal pain, severe nausea/vomiting, or problems with urination or bowel movements unless otherwise stated above.  Pertinent History Reviewed:  Reviewed past medical,surgical, social, obstetrical and family history.  Reviewed problem list, medications and allergies. OB History  Gravida Para Term Preterm AB Living  1 0 0 0 0 0  SAB IAB Ectopic Multiple Live Births  0 0 0 0      #  Outcome Date GA Lbr Len/2nd Weight Sex Type Anes PTL Lv  1 Current            Physical Assessment:   Vitals:   03/22/23 0957  BP: 124/79  Pulse: 80  Weight: 193 lb 9.6 oz (87.8 kg)  Body mass index is 36.58 kg/m.       Physical Examination:  General appearance - well appearing, and in no distress  Mental status - alert, oriented to person, place, and time  Psych:  She has a normal mood and affect  Skin - warm and dry, normal color, no suspicious lesions noted  Chest - effort normal, all lung fields clear to auscultation bilaterally  Heart - normal rate and regular rhythm  Abdomen - soft, nontender  Extremities:  No swelling or varicosities noted  Thin prep pap is not done   Chaperone: N/A    TODAY'S NT> scheduled for 1130 today  No results found for this or any previous visit (from the past 24 hour(s)).  Assessment & Plan:  1) Low-Risk Pregnancy G1P0000 at 107w1d with an Estimated Date of Delivery: 10/03/23   2) Initial OB visit  Meds:  Meds ordered this encounter  Medications   Blood Pressure Monitor MISC    Sig: For regular home bp monitoring during pregnancy    Dispense:  1  each    Refill:  0    Z34.81 Please mail to patient    Initial labs obtained Continue prenatal vitamins Reviewed n/v relief measures and warning s/s to report Reviewed recommended weight gain based on pre-gravid BMI Encouraged well-balanced diet Genetic & carrier screening discussed: requests Panorama, NT/IT, and Horizon ,  Ultrasound discussed; fetal survey: requested CCNC completed> form faxed if has or is planning to apply for medicaid The nature of [facility] [employer] with multiple MDs and other Advanced Practice Providers was explained to patient; also emphasized that fellows, residents, and students are part of our team. Does not have home bp cuff. Office bp cuff given: no. Rx sent: yes. Check bp weekly, let us know if consistently >140/90.   Indications for ASA  therapy (per uptodate) OR Two or more of the following: Nulliparity Yes Obesity (BMI>30 kg/m2) Yes  Follow-up: Return in about 4 weeks (around 04/19/2023) for LROB, 2nd IT, CNM; then 7wks from now anatomy u/s and LROB w/ CNM.   Orders Placed This Encounter  Procedures   Urine Culture   GC/Chlamydia Probe Amp   CBC/D/Plt+RPR+Rh+ABO+RubIgG...   [facility] PRENATAL TEST   HORIZON CUSTOM    Cheral Marker CNM, [facility] 03/22/2023 10:51 AM

## 2023-03-22 NOTE — Patient Instructions (Signed)
[place], thank you for choosing our office today! We appreciate the opportunity to meet your healthcare needs. You may receive a short survey by mail, e-mail, or through MyChart. If you are happy with your care we would appreciate if you could take just a few minutes to complete the survey questions. We read all of your comments and take your feedback very seriously. Thank you again for choosing our office.  [facility]  [facility] ([address] [place], [place] [address]) Entrance C, located off of [address] Free 24/7 valet parking   Nausea & Vomiting Have saltine crackers or pretzels by your bed and eat a few bites before you raise your head out of bed in the morning Eat small frequent meals throughout the day instead of large meals Drink plenty of fluids throughout the day to stay hydrated, just don't drink a lot of fluids with your meals.  This can make your stomach fill up faster making you feel sick Do not brush your teeth right after you eat Products with real ginger are good for nausea, like ginger ale and ginger hard candy Make sure it says made with real ginger! Sucking on sour candy like lemon heads is also good for nausea If your prenatal vitamins make you nauseated, take them at night so you will sleep through the nausea Sea Bands If you feel like you need medicine for the nausea & vomiting please let us know If you are unable to keep any fluids or food down please let us know   Constipation Drink plenty of fluid, preferably water, throughout the day Eat foods high in fiber such as fruits, vegetables, and grains Exercise, such as walking, is a good way to keep your bowels regular Drink warm fluids, especially warm prune juice, or decaf coffee Eat a 1/2 cup of real oatmeal (not instant), 1/2 cup applesauce, and 1/2-1 cup warm prune juice every day If needed, you may take Colace (docusate sodium) stool softener  once or twice a day to help keep the stool soft.  If you still are having problems with constipation, you may take Miralax once daily as needed to help keep your bowels regular.   Home Blood Pressure Monitoring for Patients   Your provider has recommended that you check your blood pressure (BP) at least once a week at home. If you do not have a blood pressure cuff at home, one will be provided for you. Contact your provider if you have not received your monitor within 1 week.   Helpful Tips for Accurate Home Blood Pressure Checks  Don't smoke, exercise, or drink caffeine 30 minutes before checking your BP Use the restroom before checking your BP (a full bladder can raise your pressure) Relax in a comfortable upright chair Feet on the ground Left arm resting comfortably on a flat surface at the level of your heart Legs uncrossed Back supported Sit quietly and don't talk Place the cuff on your bare arm Adjust snuggly, so that only two fingertips can fit between your skin and the top of the cuff Check 2 readings separated by at least one minute Keep a log of your BP readings For a visual, please reference this diagram: http://ccnc.care/bpdiagram  Provider Name: [facility]     Phone: [phone]  Zone 1: ALL CLEAR  Continue to monitor your symptoms:  BP reading is less than 140 (top number) or less than 90 (bottom   number)  No right upper stomach pain No headaches or seeing spots No feeling nauseated or throwing up No swelling in face and hands  Zone 2: CAUTION Call your doctor's office for any of the following:  BP reading is greater than 140 (top number) or greater than 90 (bottom number)  Stomach pain under your ribs in the middle or right side Headaches or seeing spots Feeling nauseated or throwing up Swelling in face and hands  Zone 3: EMERGENCY  Seek immediate medical care if you have any of the following:  BP reading is greater than160 (top number) or greater than  110 (bottom number) Severe headaches not improving with Tylenol Serious difficulty catching your breath Any worsening symptoms from Zone 2    First Trimester of Pregnancy The first trimester of pregnancy is from week 1 until the end of week 12 (months 1 through 3). A week after a sperm fertilizes an egg, the egg will implant on the wall of the uterus. This embryo will begin to develop into a baby. Genes from you and your partner are forming the baby. The female genes determine whether the baby is a boy or a girl. At 6-8 weeks, the eyes and face are formed, and the heartbeat can be seen on ultrasound. At the end of 12 weeks, all the baby's organs are formed.  Now that you are pregnant, you will want to do everything you can to have a healthy baby. Two of the most important things are to get good prenatal care and to follow your health care provider's instructions. Prenatal care is all the medical care you receive before the baby's birth. This care will help prevent, find, and treat any problems during the pregnancy and childbirth. BODY CHANGES Your body goes through many changes during pregnancy. The changes vary from woman to woman.  You may gain or lose a couple of pounds at first. You may feel sick to your stomach (nauseous) and throw up (vomit). If the vomiting is uncontrollable, call your health care provider. You may tire easily. You may develop headaches that can be relieved by medicines approved by your health care provider. You may urinate more often. Painful urination may mean you have a bladder infection. You may develop heartburn as a result of your pregnancy. You may develop constipation because certain hormones are causing the muscles that push waste through your intestines to slow down. You may develop hemorrhoids or swollen, bulging veins (varicose veins). Your breasts may begin to grow larger and become tender. Your nipples may stick out more, and the tissue that surrounds them  (areola) may become darker. Your gums may bleed and may be sensitive to brushing and flossing. Dark spots or blotches (chloasma, mask of pregnancy) may develop on your face. This will likely fade after the baby is born. Your menstrual periods will stop. You may have a loss of appetite. You may develop cravings for certain kinds of food. You may have changes in your emotions from day to day, such as being excited to be pregnant or being concerned that something may go wrong with the pregnancy and baby. You may have more vivid and strange dreams. You may have changes in your hair. These can include thickening of your hair, rapid growth, and changes in texture. Some women also have hair loss during or after pregnancy, or hair that feels dry or thin. Your hair will most likely return to normal after your baby is born. WHAT TO EXPECT AT YOUR PRENATAL   VISITS During a routine prenatal visit: You will be weighed to make sure you and the baby are growing normally. Your blood pressure will be taken. Your abdomen will be measured to track your baby's growth. The fetal heartbeat will be listened to starting around week 10 or 12 of your pregnancy. Test results from any previous visits will be discussed. Your health care provider may ask you: How you are feeling. If you are feeling the baby move. If you have had any abnormal symptoms, such as leaking fluid, bleeding, severe headaches, or abdominal cramping. If you have any questions. Other tests that may be performed during your first trimester include: Blood tests to find your blood type and to check for the presence of any previous infections. They will also be used to check for low iron levels (anemia) and Rh antibodies. Later in the pregnancy, blood tests for diabetes will be done along with other tests if problems develop. Urine tests to check for infections, diabetes, or protein in the urine. An ultrasound to confirm the proper growth and development  of the baby. An amniocentesis to check for possible genetic problems. Fetal screens for spina bifida and Down syndrome. You may need other tests to make sure you and the baby are doing well. HOME CARE INSTRUCTIONS  Medicines Follow your health care provider's instructions regarding medicine use. Specific medicines may be either safe or unsafe to take during pregnancy. Take your prenatal vitamins as directed. If you develop constipation, try taking a stool softener if your health care provider approves. Diet Eat regular, well-balanced meals. Choose a variety of foods, such as meat or vegetable-based protein, fish, milk and low-fat dairy products, vegetables, fruits, and whole grain breads and cereals. Your health care provider will help you determine the amount of weight gain that is right for you. Avoid raw meat and uncooked cheese. These carry germs that can cause birth defects in the baby. Eating four or five small meals rather than three large meals a day may help relieve nausea and vomiting. If you start to feel nauseous, eating a few soda crackers can be helpful. Drinking liquids between meals instead of during meals also seems to help nausea and vomiting. If you develop constipation, eat more high-fiber foods, such as fresh vegetables or fruit and whole grains. Drink enough fluids to keep your urine clear or pale yellow. Activity and Exercise Exercise only as directed by your health care provider. Exercising will help you: Control your weight. Stay in shape. Be prepared for labor and delivery. Experiencing pain or cramping in the lower abdomen or low back is a good sign that you should stop exercising. Check with your health care provider before continuing normal exercises. Try to avoid standing for long periods of time. Move your legs often if you must stand in one place for a long time. Avoid heavy lifting. Wear low-heeled shoes, and practice good posture. You may continue to have sex  unless your health care provider directs you otherwise. Relief of Pain or Discomfort Wear a good support bra for breast tenderness.   Take warm sitz baths to soothe any pain or discomfort caused by hemorrhoids. Use hemorrhoid cream if your health care provider approves.   Rest with your legs elevated if you have leg cramps or low back pain. If you develop varicose veins in your legs, wear support hose. Elevate your feet for 15 minutes, 3-4 times a day. Limit salt in your diet. Prenatal Care Schedule your prenatal visits by the   twelfth week of pregnancy. They are usually scheduled monthly at first, then more often in the last 2 months before delivery. Write down your questions. Take them to your prenatal visits. Keep all your prenatal visits as directed by your health care provider. Safety Wear your seat belt at all times when driving. Make a list of emergency phone numbers, including numbers for family, friends, the hospital, and police and fire departments. General Tips Ask your health care provider for a referral to a local prenatal education class. Begin classes no later than at the beginning of month 6 of your pregnancy. Ask for help if you have counseling or nutritional needs during pregnancy. Your health care provider can offer advice or refer you to specialists for help with various needs. Do not use hot tubs, steam rooms, or saunas. Do not douche or use tampons or scented sanitary pads. Do not cross your legs for long periods of time. Avoid cat litter boxes and soil used by cats. These carry germs that can cause birth defects in the baby and possibly loss of the fetus by miscarriage or stillbirth. Avoid all smoking, herbs, alcohol, and medicines not prescribed by your health care provider. Chemicals in these affect the formation and growth of the baby. Schedule a dentist appointment. At home, brush your teeth with a soft toothbrush and be gentle when you floss. SEEK MEDICAL CARE IF:   You have dizziness. You have mild pelvic cramps, pelvic pressure, or nagging pain in the abdominal area. You have persistent nausea, vomiting, or diarrhea. You have a bad smelling vaginal discharge. You have pain with urination. You notice increased swelling in your face, hands, legs, or ankles. SEEK IMMEDIATE MEDICAL CARE IF:  You have a fever. You are leaking fluid from your vagina. You have spotting or bleeding from your vagina. You have severe abdominal cramping or pain. You have rapid weight gain or loss. You vomit blood or material that looks like coffee grounds. You are exposed to German measles and have never had them. You are exposed to fifth disease or chickenpox. You develop a severe headache. You have shortness of breath. You have any kind of trauma, such as from a fall or a car accident. Document Released: 06/01/2001 Document Revised: 10/22/2013 Document Reviewed: 04/17/2013 [employer] Patient Information 2015 [employer]. This information is not intended to replace advice given to you by your health care provider. Make sure you discuss any questions you have with your health care provider.  

## 2023-03-22 NOTE — Addendum Note (Signed)
Addended by: Moss Mc on: 03/22/2023 12:10 PM   Modules accepted: Orders

## 2023-03-23 LAB — CBC/D/PLT+RPR+RH+ABO+RUBIGG...
Antibody Screen: NEGATIVE
Basophils Absolute: 0.1 10*3/uL (ref 0.0–0.2)
Basos: 1 %
EOS (ABSOLUTE): 0.2 10*3/uL (ref 0.0–0.4)
Eos: 2 %
HCV Ab: NONREACTIVE
HIV Screen 4th Generation wRfx: NONREACTIVE
Hematocrit: 37.7 % (ref 34.0–46.6)
Hemoglobin: 12.5 g/dL (ref 11.1–15.9)
Hepatitis B Surface Ag: NEGATIVE
Immature Grans (Abs): 0.1 10*3/uL (ref 0.0–0.1)
Immature Granulocytes: 1 %
Lymphocytes Absolute: 2.4 10*3/uL (ref 0.7–3.1)
Lymphs: 23 %
MCH: 27.6 pg (ref 26.6–33.0)
MCHC: 33.2 g/dL (ref 31.5–35.7)
MCV: 83 fL (ref 79–97)
Monocytes Absolute: 0.6 10*3/uL (ref 0.1–0.9)
Monocytes: 5 %
Neutrophils Absolute: 7.4 10*3/uL — ABNORMAL HIGH (ref 1.4–7.0)
Neutrophils: 68 %
Platelets: 417 10*3/uL (ref 150–450)
RBC: 4.53 x10E6/uL (ref 3.77–5.28)
RDW: 12.9 % (ref 11.7–15.4)
RPR Ser Ql: NONREACTIVE
Rh Factor: POSITIVE
Rubella Antibodies, IGG: 1.03 {index} (ref >0.99)
WBC: 10.7 10*3/uL (ref 3.4–10.8)

## 2023-03-23 LAB — HCV INTERPRETATION

## 2023-03-24 ENCOUNTER — Encounter: Payer: Self-pay | Admitting: Women's Health

## 2023-03-24 DIAGNOSIS — R8271 Bacteriuria: Secondary | ICD-10-CM | POA: Insufficient documentation

## 2023-03-24 LAB — INTEGRATED 1
Crown Rump Length: 64 mm
Gest. Age on Collection Date: 12.6 wk
Maternal Age at EDD: 25.1 a
Nuchal Translucency (NT): 1.6 mm
Number of Fetuses: 1
PAPP-A Value: 573.4 ng/mL
Sonographer ID#: 309760
Weight: 193 [lb_av]

## 2023-03-24 LAB — URINE CULTURE

## 2023-03-24 LAB — GC/CHLAMYDIA PROBE AMP
Chlamydia trachomatis, NAA: NEGATIVE
Neisseria Gonorrhoeae by PCR: NEGATIVE

## 2023-03-24 MED ORDER — AMOXICILLIN 500 MG PO CAPS: 500.0000 mg | ORAL_CAPSULE | Freq: Two times a day (BID) | ORAL | 0 refills | Status: DC

## 2023-03-24 NOTE — Addendum Note (Signed)
Addended by: Cheral Marker on: 03/24/2023 01:49 PM   Modules accepted: Orders

## 2023-03-29 ENCOUNTER — Encounter: Payer: Self-pay | Admitting: Women's Health

## 2023-03-29 ENCOUNTER — Other Ambulatory Visit (INDEPENDENT_AMBULATORY_CARE_PROVIDER_SITE_OTHER): Payer: Medicaid Other | Admitting: *Deleted

## 2023-03-29 VITALS — Wt 193.0 lb

## 2023-03-29 DIAGNOSIS — Z3401 Encounter for supervision of normal first pregnancy, first trimester: Secondary | ICD-10-CM

## 2023-03-29 DIAGNOSIS — Z3A13 13 weeks gestation of pregnancy: Secondary | ICD-10-CM

## 2023-03-29 LAB — PANORAMA PRENATAL TEST FULL PANEL:PANORAMA TEST PLUS 5 ADDITIONAL MICRODELETIONS: FETAL FRACTION: 2.7

## 2023-03-29 NOTE — Progress Notes (Signed)
   NURSE VISIT- [facility]  SUBJECTIVE:  Shelly Mckenzie is a 22 y.o. G74P0000 female here for [facility] NIPT . She is [redacted]w[redacted]d pregnant.   OBJECTIVE:  Appears well, in no apparent distress  Blood work drawn from right [facility] without difficulty. 1 attempt(s).   ASSESSMENT: Pregnancy [redacted]w[redacted]d [facility]or[facility] NIPT  PLAN: [facility] portal information given and instructed patient how to access results Follow-up as scheduled  Jobe Marker  03/29/2023 2:36 PM

## 2023-04-02 ENCOUNTER — Encounter: Payer: Self-pay | Admitting: Women's Health

## 2023-04-02 LAB — HORIZON CUSTOM: REPORT SUMMARY: POSITIVE — AB

## 2023-04-04 ENCOUNTER — Encounter: Payer: Self-pay | Admitting: Women's Health

## 2023-04-04 DIAGNOSIS — D563 Thalassemia minor: Secondary | ICD-10-CM | POA: Insufficient documentation

## 2023-04-11 ENCOUNTER — Encounter: Payer: Self-pay | Admitting: Women's Health

## 2023-04-11 LAB — PANORAMA PRENATAL TEST FULL PANEL:PANORAMA TEST PLUS 5 ADDITIONAL MICRODELETIONS: FETAL FRACTION: 3.1

## 2023-04-19 ENCOUNTER — Ambulatory Visit (INDEPENDENT_AMBULATORY_CARE_PROVIDER_SITE_OTHER): Payer: Medicaid Other | Admitting: Women's Health

## 2023-04-19 ENCOUNTER — Encounter: Payer: Self-pay | Admitting: Women's Health

## 2023-04-19 VITALS — BP 118/66 | HR 78 | Wt 195.0 lb

## 2023-04-19 DIAGNOSIS — Z363 Encounter for antenatal screening for malformations: Secondary | ICD-10-CM

## 2023-04-19 DIAGNOSIS — D563 Thalassemia minor: Secondary | ICD-10-CM

## 2023-04-19 DIAGNOSIS — Z3A16 16 weeks gestation of pregnancy: Secondary | ICD-10-CM

## 2023-04-19 DIAGNOSIS — Z87440 Personal history of urinary (tract) infections: Secondary | ICD-10-CM

## 2023-04-19 DIAGNOSIS — Z3402 Encounter for supervision of normal first pregnancy, second trimester: Secondary | ICD-10-CM

## 2023-04-19 DIAGNOSIS — Z1379 Encounter for other screening for genetic and chromosomal anomalies: Secondary | ICD-10-CM

## 2023-04-19 NOTE — Patient Instructions (Signed)
Shelly Mckenzie, thank you for choosing our office today! We appreciate the opportunity to meet your healthcare needs. You may receive a short survey by mail, e-mail, or through MyChart. If you are happy with your care we would appreciate if you could take just a few minutes to complete the survey questions. We read all of your comments and take your feedback very seriously. Thank you again for choosing our office.  [facility] Team at [facility] ([address] [place], [place] [address]) Entrance C, located off of [address] Free 24/7 valet parking  Go to [email or web address] to register for FREE online childbirth classes  Call the office ([phone]) or go to [facility] if: You begin to severe cramping Your water breaks.  Sometimes it is a big gush of fluid, sometimes it is just a trickle that keeps getting your panties wet or running down your legs You have vaginal bleeding.  It is normal to have a small amount of spotting if your cervix was checked.   [name] [facility]/Family Doctors [facility] ([facility]): [address], [phone]           [facility]: [address], [phone]                [facility] ([facility]): [address], [phone] (call to ask if accepting patients) [facility]: [address], [place], [phone]    [place] [facility]/Family Doctors [facility] ([facility]): [address], [phone] [facility]: [address], [phone] [facility]: [address], [phone]  [place] [facility]  [facility] ([facility]): [phone] [facility]: [address], [phone]   [place] [facility]: [address], [phone]  [place] [facility]  [facility]: [address], [phone]  Home Blood Pressure Monitoring for Patients   Your provider has recommended that you check your blood pressure (BP) at least once a week at home. If you do not have a blood pressure cuff at home, one will be provided for you. Contact your provider if you have not received your monitor within 1 week.   Helpful Tips for Accurate Home Blood Pressure Checks  Don't smoke, exercise, or drink caffeine 30 minutes before checking your BP Use the restroom before checking your BP (a full bladder can raise your pressure) Relax in a comfortable upright chair Feet on the ground Left arm resting comfortably on a flat surface at the level of your heart Legs uncrossed Back supported Sit quietly and don't talk Place the cuff on your bare arm Adjust snuggly, so that only two fingertips can fit between your skin and the top of the cuff Check 2 readings separated by at least one minute Keep a log of your BP readings For a visual, please reference this diagram: http://[facility].care/bpdiagram  Provider Name: [facility]     Phone: [phone]  Zone 1: ALL CLEAR  Continue to monitor your symptoms:  BP reading is less than 140 (top number) or less than 90 (bottom number)  No right upper stomach pain No headaches or seeing spots No feeling nauseated or throwing up No swelling in face and hands  Zone 2: CAUTION Call your doctor's office for any of the following:  BP reading is greater than 140 (top number) or greater than   90 (bottom number)  Stomach pain under your ribs in the middle or right side Headaches or seeing spots Feeling nauseated or throwing up Swelling in face and hands  Zone 3: EMERGENCY  Seek immediate medical care if you have any of the following:  BP reading is greater than160 (top number) or greater than 110 (bottom number) Severe headaches not improving with Tylenol Serious difficulty catching your breath Any worsening symptoms from  Zone 2     Second Trimester of Pregnancy The second trimester is from week 14 through week 27 (months 4 through 6). The second trimester is often a time when you feel your best. Your body has adjusted to being pregnant, and you begin to feel better physically. Usually, morning sickness has lessened or quit completely, you may have more energy, and you may have an increase in appetite. The second trimester is also a time when the fetus is growing rapidly. At the end of the sixth month, the fetus is about 9 inches long and weighs about 1 pounds. You will likely begin to feel the baby move (quickening) between 16 and 20 weeks of pregnancy. Body changes during your second trimester Your body continues to go through many changes during your second trimester. The changes vary from woman to woman. Your weight will continue to increase. You will notice your lower abdomen bulging out. You may begin to get stretch marks on your hips, abdomen, and breasts. You may develop headaches that can be relieved by medicines. The medicines should be approved by your health care provider. You may urinate more often because the fetus is pressing on your bladder. You may develop or continue to have heartburn as a result of your pregnancy. You may develop constipation because certain hormones are causing the muscles that push waste through your intestines to slow down. You may develop hemorrhoids or swollen, bulging veins (varicose veins). You may have back pain. This is caused by: Weight gain. Pregnancy hormones that are relaxing the joints in your pelvis. A shift in weight and the muscles that support your balance. Your breasts will continue to grow and they will continue to become tender. Your gums may bleed and may be sensitive to brushing and flossing. Dark spots or blotches (chloasma, mask of pregnancy) may develop on your face. This will likely fade after the baby is born. A dark line from your belly button to  the pubic area (linea nigra) may appear. This will likely fade after the baby is born. You may have changes in your hair. These can include thickening of your hair, rapid growth, and changes in texture. Some women also have hair loss during or after pregnancy, or hair that feels dry or thin. Your hair will most likely return to normal after your baby is born.  What to expect at prenatal visits During a routine prenatal visit: You will be weighed to make sure you and the fetus are growing normally. Your blood pressure will be taken. Your abdomen will be measured to track your baby's growth. The fetal heartbeat will be listened to. Any test results from the previous visit will be discussed.  Your health care provider may ask you: How you are feeling. If you are feeling the baby move. If you have had any abnormal symptoms, such as leaking fluid, bleeding, severe headaches, or abdominal cramping. If you are using any tobacco products, including cigarettes, chewing tobacco, and electronic cigarettes. If you have any questions.  Other tests that may be performed during   your second trimester include: Blood tests that check for: Low iron levels (anemia). High blood sugar that affects pregnant women (gestational diabetes) between 24 and 28 weeks. Rh antibodies. This is to check for a protein on red blood cells (Rh factor). Urine tests to check for infections, diabetes, or protein in the urine. An ultrasound to confirm the proper growth and development of the baby. An amniocentesis to check for possible genetic problems. Fetal screens for spina bifida and Down syndrome. HIV (human immunodeficiency virus) testing. Routine prenatal testing includes screening for HIV, unless you choose not to have this test.  Follow these instructions at home: Medicines Follow your health care provider's instructions regarding medicine use. Specific medicines may be either safe or unsafe to take during  pregnancy. Take a prenatal vitamin that contains at least 600 micrograms (mcg) of folic acid. If you develop constipation, try taking a stool softener if your health care provider approves. Eating and drinking Eat a balanced diet that includes fresh fruits and vegetables, whole grains, good sources of protein such as meat, eggs, or tofu, and low-fat dairy. Your health care provider will help you determine the amount of weight gain that is right for you. Avoid raw meat and uncooked cheese. These carry germs that can cause birth defects in the baby. If you have low calcium intake from food, talk to your health care provider about whether you should take a daily calcium supplement. Limit foods that are high in fat and processed sugars, such as fried and sweet foods. To prevent constipation: Drink enough fluid to keep your urine clear or pale yellow. Eat foods that are high in fiber, such as fresh fruits and vegetables, whole grains, and beans. Activity Exercise only as directed by your health care provider. Most women can continue their usual exercise routine during pregnancy. Try to exercise for 30 minutes at least 5 days a week. Stop exercising if you experience uterine contractions. Avoid heavy lifting, wear low heel shoes, and practice good posture. A sexual relationship may be continued unless your health care provider directs you otherwise. Relieving pain and discomfort Wear a good support bra to prevent discomfort from breast tenderness. Take warm sitz baths to soothe any pain or discomfort caused by hemorrhoids. Use hemorrhoid cream if your health care provider approves. Rest with your legs elevated if you have leg cramps or low back pain. If you develop varicose veins, wear support hose. Elevate your feet for 15 minutes, 3-4 times a day. Limit salt in your diet. Prenatal Care Write down your questions. Take them to your prenatal visits. Keep all your prenatal visits as told by your health  care provider. This is important. Safety Wear your seat belt at all times when driving. Make a list of emergency phone numbers, including numbers for family, friends, the hospital, and police and fire departments. General instructions Ask your health care provider for a referral to a local prenatal education class. Begin classes no later than the beginning of month 6 of your pregnancy. Ask for help if you have counseling or nutritional needs during pregnancy. Your health care provider can offer advice or refer you to specialists for help with various needs. Do not use hot tubs, steam rooms, or saunas. Do not douche or use tampons or scented sanitary pads. Do not cross your legs for long periods of time. Avoid cat litter boxes and soil used by cats. These carry germs that can cause birth defects in the baby and possibly loss of the   fetus by miscarriage or stillbirth. Avoid all smoking, herbs, alcohol, and unprescribed drugs. Chemicals in these products can affect the formation and growth of the baby. Do not use any products that contain nicotine or tobacco, such as cigarettes and e-cigarettes. If you need help quitting, ask your health care provider. Visit your dentist if you have not gone yet during your pregnancy. Use a soft toothbrush to brush your teeth and be gentle when you floss. Contact a health care provider if: You have dizziness. You have mild pelvic cramps, pelvic pressure, or nagging pain in the abdominal area. You have persistent nausea, vomiting, or diarrhea. You have a bad smelling vaginal discharge. You have pain when you urinate. Get help right away if: You have a fever. You are leaking fluid from your vagina. You have spotting or bleeding from your vagina. You have severe abdominal cramping or pain. You have rapid weight gain or weight loss. You have shortness of breath with chest pain. You notice sudden or extreme swelling of your face, hands, ankles, feet, or legs. You  have not felt your baby move in over an hour. You have severe headaches that do not go away when you take medicine. You have vision changes. Summary The second trimester is from week 14 through week 27 (months 4 through 6). It is also a time when the fetus is growing rapidly. Your body goes through many changes during pregnancy. The changes vary from woman to woman. Avoid all smoking, herbs, alcohol, and unprescribed drugs. These chemicals affect the formation and growth your baby. Do not use any tobacco products, such as cigarettes, chewing tobacco, and e-cigarettes. If you need help quitting, ask your health care provider. Contact your health care provider if you have any questions. Keep all prenatal visits as told by your health care provider. This is important. This information is not intended to replace advice given to you by your health care provider. Make sure you discuss any questions you have with your health care provider. Document Released: 06/01/2001 Document Revised: 11/13/2015 Document Reviewed: 08/08/2012 Elsevier Interactive Patient Education  2017 [employer]  

## 2023-04-19 NOTE — Progress Notes (Signed)
LOW-RISK PREGNANCY VISIT Patient name: Shelly Mckenzie MRN [id number]  Date of birth: 2000/11/03 Chief Complaint:   Routine Prenatal Visit  History of Present Illness:   Shelly Mckenzie is a 22 y.o. G35P0000 female at [redacted]w[redacted]d with an Estimated Date of Delivery: 10/03/23 being seen today for ongoing management of a low-risk pregnancy.   Today she reports no complaints. Contractions: Not present. Vag. Bleeding: None.   . denies leaking of fluid.     03/22/2023   10:14 AM 07/05/2022    2:16 PM  Depression screen PHQ 2/9  Decreased Interest 0 0  Down, Depressed, Hopeless 0 0  PHQ - 2 Score 0 0  Altered sleeping 0 0  Tired, decreased energy 1 0  Change in appetite 0 0  Feeling bad or failure about yourself  0 0  Trouble concentrating 0 0  Moving slowly or fidgety/restless 0 0  Suicidal thoughts 0 0  PHQ-9 Score 1 0        03/22/2023   10:14 AM 07/05/2022    2:16 PM  GAD 7 : Generalized Anxiety Score  Nervous, Anxious, on Edge 0 0  Control/stop worrying 0 0  Worry too much - different things 0 0  Trouble relaxing 0 0  Restless 0 0  Easily annoyed or irritable 0 0  Afraid - awful might happen 0 0  Total GAD 7 Score 0 0      Review of Systems:   Pertinent items are noted in HPI Denies abnormal vaginal discharge w/ itching/odor/irritation, headaches, visual changes, shortness of breath, chest pain, abdominal pain, severe nausea/vomiting, or problems with urination or bowel movements unless otherwise stated above. Pertinent History Reviewed:  Reviewed past medical,surgical, social, obstetrical and family history.  Reviewed problem list, medications and allergies. Physical Assessment:   Vitals:   04/19/23 0835  BP: 118/66  Pulse: 78  Weight: 195 lb (88.5 kg)  Body mass index is 36.84 kg/m.        Physical Examination:   General appearance: Well appearing, and in no distress  Mental status: Alert, oriented to person, place, and time  Skin: Warm & dry  Cardiovascular:  Normal heart rate noted  Respiratory: Normal respiratory effort, no distress  Abdomen: Soft, gravid, nontender  Pelvic: Cervical exam deferred         Extremities:    Fetal Status: Fetal Heart Rate (bpm): 157        Chaperone: N/A   No results found for this or any previous visit (from the past 24 hour(s)).  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  Labs/procedures today: urine culture and 2nd IT  Plan:  Continue routine obstetrical care  Next visit: prefers will be in person for u/s     Reviewed: Preterm labor symptoms and general obstetric precautions including but not limited to vaginal bleeding, contractions, leaking of fluid and fetal movement were reviewed in detail with the patient.  All questions were answered. Does have home bp cuff. Office bp cuff given: not applicable. Check bp weekly, let us know if consistently >140 and/or >90.  Follow-up: Return for As scheduled.  Future Appointments  Date Time Provider Department Center  05/10/2023  2:15 PM [facility] None  05/10/2023  3:10 PM Cheral Marker, CNM [facility]    Orders Placed This Encounter  Procedures   Urine Culture   US OB Comp + 14 Wk   INTEGRATED 2   Cheral Marker CNM, [facility] 04/19/2023 8:56 AM

## 2023-04-21 LAB — INTEGRATED 2
AFP MoM: 1.18
Alpha-Fetoprotein: 32.9 ng/mL
Crown Rump Length: 64 mm
DIA MoM: 0.46
DIA Value: 64.6 pg/mL
Estriol, Unconjugated: 1.08 ng/mL
Gest. Age on Collection Date: 12.6 wk
Gestational Age: 16.6 wk
Maternal Age at EDD: 25.1 a
Nuchal Translucency (NT): 1.6 mm
Nuchal Translucency MoM: 1.17
Number of Fetuses: 1
PAPP-A MoM: 0.76
PAPP-A Value: 573.4 ng/mL
Sonographer ID#: 309760
Test Results:: NEGATIVE
Weight: 193 [lb_av]
Weight: 193 [lb_av]
hCG MoM: 0.54
hCG Value: 14.7 [IU]/mL
uE3 MoM: 1.23

## 2023-04-21 LAB — URINE CULTURE: Organism ID, Bacteria: NO GROWTH

## 2023-05-10 ENCOUNTER — Other Ambulatory Visit: Payer: Medicaid Other | Admitting: Radiology

## 2023-05-10 ENCOUNTER — Encounter: Payer: Self-pay | Admitting: Women's Health

## 2023-05-10 ENCOUNTER — Ambulatory Visit (INDEPENDENT_AMBULATORY_CARE_PROVIDER_SITE_OTHER): Payer: Medicaid Other | Admitting: Women's Health

## 2023-05-10 VITALS — BP 108/71 | HR 69 | Wt 198.0 lb

## 2023-05-10 DIAGNOSIS — Z363 Encounter for antenatal screening for malformations: Secondary | ICD-10-CM | POA: Diagnosis not present

## 2023-05-10 DIAGNOSIS — Z3402 Encounter for supervision of normal first pregnancy, second trimester: Secondary | ICD-10-CM

## 2023-05-10 DIAGNOSIS — Z3A19 19 weeks gestation of pregnancy: Secondary | ICD-10-CM | POA: Diagnosis not present

## 2023-05-10 NOTE — Progress Notes (Signed)
LOW-RISK PREGNANCY VISIT Patient name: Shelly Mckenzie MRN [id number]  Date of birth: 2001/03/19 Chief Complaint:   Routine Prenatal Visit ([place] today!!!)  History of Present Illness:   MIA LEIMER is a 22 y.o. G52P0000 female at [redacted]w[redacted]d with an Estimated Date of Delivery: 10/03/23 being seen today for ongoing management of a low-risk pregnancy.   Today she reports no complaints. Contractions: Not present. Vag. Bleeding: None.  Movement: Present. denies leaking of fluid.     03/22/2023   10:14 AM 07/05/2022    2:16 PM  Depression screen PHQ 2/9  Decreased Interest 0 0  Down, Depressed, Hopeless 0 0  PHQ - 2 Score 0 0  Altered sleeping 0 0  Tired, decreased energy 1 0  Change in appetite 0 0  Feeling bad or failure about yourself  0 0  Trouble concentrating 0 0  Moving slowly or fidgety/restless 0 0  Suicidal thoughts 0 0  PHQ-9 Score 1 0        03/22/2023   10:14 AM 07/05/2022    2:16 PM  GAD 7 : Generalized Anxiety Score  Nervous, Anxious, on Edge 0 0  Control/stop worrying 0 0  Worry too much - different things 0 0  Trouble relaxing 0 0  Restless 0 0  Easily annoyed or irritable 0 0  Afraid - awful might happen 0 0  Total GAD 7 Score 0 0      Review of Systems:   Pertinent items are noted in HPI Denies abnormal vaginal discharge w/ itching/odor/irritation, headaches, visual changes, shortness of breath, chest pain, abdominal pain, severe nausea/vomiting, or problems with urination or bowel movements unless otherwise stated above. Pertinent History Reviewed:  Reviewed past medical,surgical, social, obstetrical and family history.  Reviewed problem list, medications and allergies. Physical Assessment:   Vitals:   05/10/23 1518  BP: 108/71  Pulse: 69  Weight: 198 lb (89.8 kg)  Body mass index is 37.41 kg/m.        Physical Examination:   General appearance: Well appearing, and in no distress  Mental status: Alert, oriented to person, place, and time  Skin:  Warm & dry  Cardiovascular: Normal heart rate noted  Respiratory: Normal respiratory effort, no distress  Abdomen: Soft, gravid, nontender  Pelvic: Cervical exam deferred         Extremities: Edema: None  Fetal Status:     Movement: Present   Single active female fetus,  complete breech prone position   FHR = 157 bpm Anterior placenta   SVP 4.4 cm   normal amn fluid vol EFW 53%  Anatomy screen incomplete  poor resolution, fetus in prone breech position Need to complete cardiac views 4 ch ht, LVOT, RVOT, 3VV, 3VTV Need Profile and better Facial views, normal lips and NB's, nose not seen Normal ov's - neg adnexal regions - neg CDS - no free fluid CL = 4.6 cm,   closed  Chaperone: N/A   No results found for this or any previous visit (from the past 24 hour(s)).  Assessment & Plan:  1) Low-risk pregnancy G1P0000 at [redacted]w[redacted]d with an Estimated Date of Delivery: 10/03/23     Meds: No orders of the defined types were placed in this encounter.  Labs/procedures today: U/S  Plan:  Continue routine obstetrical care  Next visit: prefers will be in person for repeat u/s (limited views)     Reviewed: Preterm labor symptoms and general obstetric precautions including but not limited to vaginal bleeding,  contractions, leaking of fluid and fetal movement were reviewed in detail with the patient.  All questions were answered. Does have home bp cuff. Office bp cuff given: not applicable. Check bp weekly, let us know if consistently >140 and/or >90.  Follow-up: Return in about 4 weeks (around 06/07/2023) for LROB, CNM, US:OB F/U anatomy.  Future Appointments  Date Time Provider Department Center  06/07/2023  2:15 PM [facility] None  06/07/2023  3:10 PM Cheral Marker, CNM [facility]     No orders of the defined types were placed in this encounter.  Cheral Marker CNM, [facility] 05/10/2023 3:57 PM

## 2023-05-10 NOTE — Progress Notes (Signed)
GA =19+1  Single active female fetus,  complete breech prone position   FHR = 157 bpm Anterior placenta   SVP 4.4 cm   normal amn fluid vol EFW 53%  Anatomy screen incomplete  poor resolution, fetus in prone breech position Need to complete cardiac views 4 ch ht, LVOT, RVOT, 3VV, 3VTV Need Profile and better Facial views, normal lips and NB's, nose not seen Normal ov's - neg adnexal regions - neg CDS - no free fluid CL = 4.6 cm,   closed

## 2023-05-10 NOTE — Patient Instructions (Addendum)
Shelly Mckenzie, thank you for choosing our office today! We appreciate the opportunity to meet your healthcare needs. You may receive a short survey by mail, e-mail, or through [employer]. If you are happy with your care we would appreciate if you could take just a few minutes to complete the survey questions. We read all of your comments and take your feedback very seriously. Thank you again for choosing our office.  Center for [employer] Team at [facility] ([place], [place] [address]) Entrance C, located off of [address] parking  Go to [email or web address] to register for FREE online childbirth classes  Call the office [phone]) or go to [facility] if: You begin to severe cramping Your water breaks.  Sometimes it is a big gush of fluid, sometimes it is just a trickle that keeps getting your panties wet or running down your legs You have vaginal bleeding.  It is normal to have a small amount of spotting if your cervix was checked.   [facility] Pediatricians/Family Doctors [facility]): [address] [place], [facility]: [address] [place], [phone]                [facility]): [place], [phone] (call to ask if accepting patients) [facility], [place] Department: [address], [place], [place] Pediatricians/Family Doctors [facility]): [place]. [place], [address], [facility]: [address] [place], [facility]: [place], [place] [facility]): [phone] [facility]: [address] [place]: [address] [place] [facility]: [phone], [phone]  Home Blood Pressure Monitoring for Patients   Your provider has recommended that you check your blood pressure (BP) at least once a week at home. If you do not have a blood pressure cuff at home, one will be provided for you. Contact your provider if you have not received your monitor within 1 week.   Helpful Tips for Accurate Home Blood Pressure Checks  Don't smoke, exercise, or drink caffeine 30 minutes before checking your BP Use the restroom before checking your BP (a full bladder can raise your pressure) Relax in a comfortable upright chair Feet on the ground Left arm resting comfortably on a flat surface at the level of your heart Legs uncrossed Back supported Sit quietly and don't talk Place the cuff on your bare arm Adjust snuggly, so that only two fingertips can fit between your skin and the top of the cuff Check 2 readings separated by at least one minute Keep a log of your BP readings For a visual, please reference this diagram: http://ccnc.care/bpdiagram  Provider Name: [facility]     Phone: [phone]  Zone 1: ALL CLEAR  Continue to monitor your symptoms:  BP reading is less than 140 (top number) or less than 90 (bottom number)  No right upper stomach pain No headaches or seeing spots No feeling nauseated or throwing up No swelling in face and hands  Zone 2: CAUTION Call your doctor's office for any of the following:  BP reading is greater than 140 (top number) or greater than  90 (bottom number)  Stomach pain under your ribs in the middle or right side Headaches or seeing spots Feeling nauseated or throwing up Swelling in face and hands  Zone 3: EMERGENCY  Seek immediate medical care if you have any of the following:  BP reading is greater than160 (top number) or greater than 110 (bottom number) Severe headaches not improving with Tylenol Serious difficulty catching your breath Any worsening symptoms from  Zone 2     Second Trimester of Pregnancy The second trimester is from week 14 through week 27 (months 4 through 6). The second trimester is often a time when you feel your best. Your body has adjusted to being pregnant, and you begin to feel better physically. Usually, morning sickness has lessened or quit completely, you may have more energy, and you may have an increase in appetite. The second trimester is also a time when the fetus is growing rapidly. At the end of the sixth month, the fetus is about 9 inches long and weighs about 1 pounds. You will likely begin to feel the baby move (quickening) between 16 and 20 weeks of pregnancy. Body changes during your second trimester Your body continues to go through many changes during your second trimester. The changes vary from woman to woman. Your weight will continue to increase. You will notice your lower abdomen bulging out. You may begin to get stretch marks on your hips, abdomen, and breasts. You may develop headaches that can be relieved by medicines. The medicines should be approved by your health care provider. You may urinate more often because the fetus is pressing on your bladder. You may develop or continue to have heartburn as a result of your pregnancy. You may develop constipation because certain hormones are causing the muscles that push waste through your intestines to slow down. You may develop hemorrhoids or swollen, bulging veins (varicose veins). You may have back pain. This is caused by: Weight gain. Pregnancy hormones that are relaxing the joints in your pelvis. A shift in weight and the muscles that support your balance. Your breasts will continue to grow and they will continue to become tender. Your gums may bleed and may be sensitive to brushing and flossing. Dark spots or blotches (chloasma, mask of pregnancy) may develop on your face. This will likely fade after the baby is born. A dark line from your belly button to  the pubic area (linea nigra) may appear. This will likely fade after the baby is born. You may have changes in your hair. These can include thickening of your hair, rapid growth, and changes in texture. Some women also have hair loss during or after pregnancy, or hair that feels dry or thin. Your hair will most likely return to normal after your baby is born.  What to expect at prenatal visits During a routine prenatal visit: You will be weighed to make sure you and the fetus are growing normally. Your blood pressure will be taken. Your abdomen will be measured to track your baby's growth. The fetal heartbeat will be listened to. Any test results from the previous visit will be discussed.  Your health care provider may ask you: How you are feeling. If you are feeling the baby move. If you have had any abnormal symptoms, such as leaking fluid, bleeding, severe headaches, or abdominal cramping. If you are using any tobacco products, including cigarettes, chewing tobacco, and electronic cigarettes. If you have any questions.  Other tests that may be performed during   your second trimester include: Blood tests that check for: Low iron levels (anemia). High blood sugar that affects pregnant women (gestational diabetes) between 41 and 28 weeks. Rh antibodies. This is to check for a protein on red blood cells (Rh factor). Urine tests to check for infections, diabetes, or protein in the urine. An ultrasound to confirm the proper growth and development of the baby. An amniocentesis to check for possible genetic problems. Fetal screens for spina bifida and Down syndrome. HIV (human immunodeficiency virus) testing. Routine prenatal testing includes screening for HIV, unless you choose not to have this test.  Follow these instructions at home: Medicines Follow your health care provider's instructions regarding medicine use. Specific medicines may be either safe or unsafe to take during  pregnancy. Take a prenatal vitamin that contains at least 600 micrograms (mcg) of folic acid. If you develop constipation, try taking a stool softener if your health care provider approves. Eating and drinking Eat a balanced diet that includes fresh fruits and vegetables, whole grains, good sources of protein such as meat, eggs, or tofu, and low-fat dairy. Your health care provider will help you determine the amount of weight gain that is right for you. Avoid raw meat and uncooked cheese. These carry germs that can cause birth defects in the baby. If you have low calcium intake from food, talk to your health care provider about whether you should take a daily calcium supplement. Limit foods that are high in fat and processed sugars, such as fried and sweet foods. To prevent constipation: Drink enough fluid to keep your urine clear or pale yellow. Eat foods that are high in fiber, such as fresh fruits and vegetables, whole grains, and beans. Activity Exercise only as directed by your health care provider. Most women can continue their usual exercise routine during pregnancy. Try to exercise for 30 minutes at least 5 days a week. Stop exercising if you experience uterine contractions. Avoid heavy lifting, wear low heel shoes, and practice good posture. A sexual relationship may be continued unless your health care provider directs you otherwise. Relieving pain and discomfort Wear a good support bra to prevent discomfort from breast tenderness. Take warm sitz baths to soothe any pain or discomfort caused by hemorrhoids. Use hemorrhoid cream if your health care provider approves. Rest with your legs elevated if you have leg cramps or low back pain. If you develop varicose veins, wear support hose. Elevate your feet for 15 minutes, 3-4 times a day. Limit salt in your diet. Prenatal Care Write down your questions. Take them to your prenatal visits. Keep all your prenatal visits as told by your health  care provider. This is important. Safety Wear your seat belt at all times when driving. Make a list of emergency phone numbers, including numbers for family, friends, the hospital, and police and fire departments. General instructions Ask your health care provider for a referral to a local prenatal education class. Begin classes no later than the beginning of month 6 of your pregnancy. Ask for help if you have counseling or nutritional needs during pregnancy. Your health care provider can offer advice or refer you to specialists for help with various needs. Do not use hot tubs, steam rooms, or saunas. Do not douche or use tampons or scented sanitary pads. Do not cross your legs for long periods of time. Avoid cat litter boxes and soil used by cats. These carry germs that can cause birth defects in the baby and possibly loss of the  fetus by miscarriage or stillbirth. Avoid all smoking, herbs, alcohol, and unprescribed drugs. Chemicals in these products can affect the formation and growth of the baby. Do not use any products that contain nicotine or tobacco, such as cigarettes and e-cigarettes. If you need help quitting, ask your health care provider. Visit your dentist if you have not gone yet during your pregnancy. Use a soft toothbrush to brush your teeth and be gentle when you floss. Contact a health care provider if: You have dizziness. You have mild pelvic cramps, pelvic pressure, or nagging pain in the abdominal area. You have persistent nausea, vomiting, or diarrhea. You have a bad smelling vaginal discharge. You have pain when you urinate. Get help right away if: You have a fever. You are leaking fluid from your vagina. You have spotting or bleeding from your vagina. You have severe abdominal cramping or pain. You have rapid weight gain or weight loss. You have shortness of breath with chest pain. You notice sudden or extreme swelling of your face, hands, ankles, feet, or legs. You  have not felt your baby move in over an hour. You have severe headaches that do not go away when you take medicine. You have vision changes. Summary The second trimester is from week 14 through week 27 (months 4 through 6). It is also a time when the fetus is growing rapidly. Your body goes through many changes during pregnancy. The changes vary from woman to woman. Avoid all smoking, herbs, alcohol, and unprescribed drugs. These chemicals affect the formation and growth your baby. Do not use any tobacco products, such as cigarettes, chewing tobacco, and e-cigarettes. If you need help quitting, ask your health care provider. Contact your health care provider if you have any questions. Keep all prenatal visits as told by your health care provider. This is important. This information is not intended to replace advice given to you by your health care provider. Make sure you discuss any questions you have with your health care provider. Document Released: 06/01/2001 Document Revised: 11/13/2015 Document Reviewed: 08/08/2012 Elsevier Interactive Patient Education  2017 [place] [facility]ACT[facility]ICIANS  [facility] [address] [place], [place] [address] Phone - [phone]   Fax - [place] [address] [place], [place]  [address] Phone - [phone]   Fax - [phone]  [place] [address], [place] [address] [place], [place]  [address] Phone - [phone]   Fax - [phone]  [facility] [address] [place], [place]  [address] Phone - [phone]   Fax - [phone]  [place] [address] [place], [place]  [address] Phone - [phone]   Fax - [place] [address] [place], [place]  [address] Phone - [phone]   Fax - [place] [facility] [place] [address], [place], [place]  [address] Phone - [phone]   Fax - [phone]  [place] [address] [place], [place], [place]  [address] Phone - [phone]   Fax - [place] [address] [place], [place]  [address] Phone - [phone]   Fax - [phone] [facility] [place]. [address] [place],   [address] Phone - [phone]   Fax - [phone]  [place] [address] [place], [place]  [place] Phone - [phone]   Fax - [phone]  [facility] [place], [place], [place]  [address] Phone - [phone]   Fax - [place] [address], [place], [place]  [address] Phone - [phone]   Fax - [phone]  [facility] [address] [place], [place]  [address] Phone - [place], [place]  [address] Phone - [phone]   Fax - [place] [address], [place], [place]  [address] Phone - [phone]   Fax - [phone]  [place] [address] [place], [place]  [address] Phone - [phone]   Fax - [place]. [place], [place]  [address] Phone - [phone]   Fax - [place], [place], [place]  [address] Phone - [phone]   Fax - [place] [address], [place], [place]  [address] Phone - [phone]   Fax - [phone]  [place] [address] [place], [place]  [address] Phone - [phone]   Fax - [place], [place], [place]  [address] Phone - [phone]   Fax - [phone]  [place] [address] [place], [place]  [address] Phone - [phone]   Fax - [phone] [place] [address] [place], [place]  [address] Phone - [phone]   Fax - [place] [address] [place], [place]  [address] Phone - [phone]   Fax - [place] [address], [place], [place]  [address] Phone - [phone]   Fax - [phone]

## 2023-06-05 ENCOUNTER — Other Ambulatory Visit: Payer: Self-pay | Admitting: Women's Health

## 2023-06-05 DIAGNOSIS — O99210 Obesity complicating pregnancy, unspecified trimester: Secondary | ICD-10-CM

## 2023-06-05 DIAGNOSIS — Z363 Encounter for antenatal screening for malformations: Secondary | ICD-10-CM

## 2023-06-07 ENCOUNTER — Encounter: Payer: Self-pay | Admitting: Women's Health

## 2023-06-07 ENCOUNTER — Ambulatory Visit (INDEPENDENT_AMBULATORY_CARE_PROVIDER_SITE_OTHER): Payer: Medicaid Other | Admitting: Women's Health

## 2023-06-07 ENCOUNTER — Ambulatory Visit (INDEPENDENT_AMBULATORY_CARE_PROVIDER_SITE_OTHER): Payer: Medicaid Other | Admitting: Radiology

## 2023-06-07 VITALS — BP 107/67 | HR 80 | Wt 199.5 lb

## 2023-06-07 DIAGNOSIS — O99212 Obesity complicating pregnancy, second trimester: Secondary | ICD-10-CM

## 2023-06-07 DIAGNOSIS — Z3402 Encounter for supervision of normal first pregnancy, second trimester: Secondary | ICD-10-CM

## 2023-06-07 DIAGNOSIS — Z3A23 23 weeks gestation of pregnancy: Secondary | ICD-10-CM | POA: Diagnosis not present

## 2023-06-07 DIAGNOSIS — Z363 Encounter for antenatal screening for malformations: Secondary | ICD-10-CM

## 2023-06-07 DIAGNOSIS — O99210 Obesity complicating pregnancy, unspecified trimester: Secondary | ICD-10-CM

## 2023-06-07 NOTE — Progress Notes (Signed)
LOW-RISK PREGNANCY VISIT Patient name: Shelly Mckenzie MRN [id number]  Date of birth: 04-Jan-2001 Chief Complaint:   Routine Prenatal Visit ([place] today!!)  History of Present Illness:   Shelly Mckenzie is a 22 y.o. G48P0000 female at [redacted]w[redacted]d with an Estimated Date of Delivery: 10/03/23 being seen today for ongoing management of a low-risk pregnancy.   Today she reports no complaints. Contractions: Not present. Vag. Bleeding: None.  Movement: Present. denies leaking of fluid.     03/22/2023   10:14 AM 07/05/2022    2:16 PM  Depression screen PHQ 2/9  Decreased Interest 0 0  Down, Depressed, Hopeless 0 0  PHQ - 2 Score 0 0  Altered sleeping 0 0  Tired, decreased energy 1 0  Change in appetite 0 0  Feeling bad or failure about yourself  0 0  Trouble concentrating 0 0  Moving slowly or fidgety/restless 0 0  Suicidal thoughts 0 0  PHQ-9 Score 1 0        03/22/2023   10:14 AM 07/05/2022    2:16 PM  GAD 7 : Generalized Anxiety Score  Nervous, Anxious, on Edge 0 0  Control/stop worrying 0 0  Worry too much - different things 0 0  Trouble relaxing 0 0  Restless 0 0  Easily annoyed or irritable 0 0  Afraid - awful might happen 0 0  Total GAD 7 Score 0 0      Review of Systems:   Pertinent items are noted in HPI Denies abnormal vaginal discharge w/ itching/odor/irritation, headaches, visual changes, shortness of breath, chest pain, abdominal pain, severe nausea/vomiting, or problems with urination or bowel movements unless otherwise stated above. Pertinent History Reviewed:  Reviewed past medical,surgical, social, obstetrical and family history.  Reviewed problem list, medications and allergies. Physical Assessment:   Vitals:   06/07/23 1515  BP: 107/67  Pulse: 80  Weight: 199 lb 8 oz (90.5 kg)  Body mass index is 37.7 kg/m.        Physical Examination:   General appearance: Well appearing, and in no distress  Mental status: Alert, oriented to person, place, and time  Skin:  Warm & dry  Cardiovascular: Normal heart rate noted  Respiratory: Normal respiratory effort, no distress  Abdomen: Soft, gravid, nontender  Pelvic: Cervical exam deferred         Extremities: Edema: None  Fetal Status:     Movement: Present   GA = 23+1 Single active female fetus, cephalic FHR = 152 bpm anterior pl, gr1 Amn fluid vol normal  SVP = 5.5 cm   EFW 52% 587g Anatomy completed,  normal Cardiac views and cranial/facial views appeared normal CL = 4.6 cm,  closed  nl L ov,  Rt ov not seen, neg adnexal regions  Chaperone: N/A   No results found for this or any previous visit (from the past 24 hours).  Assessment & Plan:  1) Low-risk pregnancy G1P0000 at [redacted]w[redacted]d with an Estimated Date of Delivery: 10/03/23    Meds: No orders of the defined types were placed in this encounter.  Labs/procedures today: U/S  Plan:  Continue routine obstetrical care  Next visit: prefers will be in person for pn2     Reviewed: Preterm labor symptoms and general obstetric precautions including but not limited to vaginal bleeding, contractions, leaking of fluid and fetal movement were reviewed in detail with the patient.  All questions were answered. Does have home bp cuff. Office bp cuff given: not applicable. Check  bp weekly, let us know if consistently >140 and/or >90.  Follow-up: Return in about 4 weeks (around 07/05/2023) for LROB, PN2, CNM, in person.  No future appointments.  No orders of the defined types were placed in this encounter.  Cheral Marker CNM, [facility] 06/07/2023 3:22 PM

## 2023-06-07 NOTE — Progress Notes (Signed)
GA = 23+1 Single active female fetus, cephalic FHR = 152 bpm anterior pl, gr1 Amn fluid vol normal  SVP = 5.5 cm   EFW 52% 587g Anatomy completed,  normal Cardiac views and cranial/facial views appeared normal CL = 4.6 cm,  closed  nl L ov,  Rt ov not seen, neg adnexal regions

## 2023-06-07 NOTE — Patient Instructions (Signed)
Shelly Mckenzie, thank you for choosing our office today! We appreciate the opportunity to meet your healthcare needs. You may receive a short survey by mail, e-mail, or through [employer]. If you are happy with your care we would appreciate if you could take just a few minutes to complete the survey questions. We read all of your comments and take your feedback very seriously. Thank you again for choosing our office.  Center for [employer] Team at [facility]  [facility] at [facility] ([address] [place], [place] [address]) Entrance C, located off of [address] Free 24/7 valet parking   You will have your sugar test next visit.  Please do not eat or drink anything after midnight the night before you come, not even water.  You will be here for at least two hours.  Please make an appointment online for the bloodwork at [email or web address] for 8:00am (or as close to this as possible). Make sure you select the [facility] service center.   CLASSES: Go to [email or web address] to register for classes (childbirth, breastfeeding, waterbirth, infant CPR, daddy bootcamp, etc.)  Call the office [phone]) or go to [facility] if: You begin to have strong, frequent contractions Your water breaks.  Sometimes it is a big gush of fluid, sometimes it is just a trickle that keeps getting your panties wet or running down your legs You have vaginal bleeding.  It is normal to have a small amount of spotting if your cervix was checked.  You don't feel your baby moving like normal.  If you don't, get you something to eat and drink and lay down and focus on feeling your baby move.   If your baby is still not moving like normal, you should call the office or go to [facility].  Call the office [phone]) or go to [facility] hospital for these signs of pre-eclampsia: Severe headache that does not go away with Tylenol Visual changes- seeing spots, double, blurred vision Pain under your right breast or upper  abdomen that does not go away with Tums or heartburn medicine Nausea and/or vomiting Severe swelling in your hands, feet, and face    [facility]/Family Doctors [facility]): [address] Colette Ribas, [phone]           [facility]: [address], [phone]                [facility]): [address], [phone]  [facility] Department: [address], [place], [phone]    [facility] Pediatricians/Family Doctors [facility]): [address], [phone] [facility]: [address] [place], [phone] [facility]: [address], [phone]  [facility] Doctors  [facility]): [phone] [facility]: [address] [phone]   [facility] Doctors [facility]: [address], [phone]  [facility] Doctors  [facility]: [phone], [phone]  Home Blood Pressure Monitoring for Patients   Your provider has recommended that you check your blood pressure (BP) at least once a week at home. If you do not have a blood pressure cuff at home, one will be provided for you. Contact your provider if you have not received your monitor within 1 week.   Helpful Tips for Accurate Home Blood Pressure Checks  Don't smoke, exercise, or drink caffeine 30 minutes before checking  your BP Use the restroom before checking your BP (a full bladder can raise your pressure) Relax in a comfortable upright chair Feet on the ground Left arm resting comfortably on a flat surface at the level of your heart Legs uncrossed Back supported Sit quietly and don't talk Place the cuff on your bare arm Adjust snuggly, so that only two fingertips can fit between your skin and the top of the cuff Check 2 readings separated by at least one  minute Keep a log of your BP readings For a visual, please reference this diagram: http://ccnc.care/bpdiagram  Provider Name: [facility]     Phone: [phone]  Zone 1: ALL CLEAR  Continue to monitor your symptoms:  BP reading is less than 140 (top number) or less than 90 (bottom number)  No right upper stomach pain No headaches or seeing spots No feeling nauseated or throwing up No swelling in face and hands  Zone 2: CAUTION Call your doctor's office for any of the following:  BP reading is greater than 140 (top number) or greater than 90 (bottom number)  Stomach pain under your ribs in the middle or right side Headaches or seeing spots Feeling nauseated or throwing up Swelling in face and hands  Zone 3: EMERGENCY  Seek immediate medical care if you have any of the following:  BP reading is greater than160 (top number) or greater than 110 (bottom number) Severe headaches not improving with Tylenol Serious difficulty catching your breath Any worsening symptoms from Zone 2   Second Trimester of Pregnancy The second trimester is from week 13 through week 28, months 4 through 6. The second trimester is often a time when you feel your best. Your body has also adjusted to being pregnant, and you begin to feel better physically. Usually, morning sickness has lessened or quit completely, you may have more energy, and you may have an increase in appetite. The second trimester is also a time when the fetus is growing rapidly. At the end of the sixth month, the fetus is about 9 inches long and weighs about 1 pounds. You will likely begin to feel the baby move (quickening) between 18 and 20 weeks of the pregnancy. BODY CHANGES Your body goes through many changes during pregnancy. The changes vary from woman to woman.  Your weight will continue to increase. You will notice your lower abdomen bulging out. You may begin to get stretch marks on your hips, abdomen, and breasts. You may  develop headaches that can be relieved by medicines approved by your health care provider. You may urinate more often because the fetus is pressing on your bladder. You may develop or continue to have heartburn as a result of your pregnancy. You may develop constipation because certain hormones are causing the muscles that push waste through your intestines to slow down. You may develop hemorrhoids or swollen, bulging veins (varicose veins). You may have back pain because of the weight gain and pregnancy hormones relaxing your joints between the bones in your pelvis and as a result of a shift in weight and the muscles that support your balance. Your breasts will continue to grow and be tender. Your gums may bleed and may be sensitive to brushing and flossing. Dark spots or blotches (chloasma, mask of pregnancy) may develop on your face. This will likely fade after the baby is born. A dark line from your belly button to the pubic area (linea nigra) may appear. This will likely fade after the  baby is born. You may have changes in your hair. These can include thickening of your hair, rapid growth, and changes in texture. Some women also have hair loss during or after pregnancy, or hair that feels dry or thin. Your hair will most likely return to normal after your baby is born. WHAT TO EXPECT AT YOUR PRENATAL VISITS During a routine prenatal visit: You will be weighed to make sure you and the fetus are growing normally. Your blood pressure will be taken. Your abdomen will be measured to track your baby's growth. The fetal heartbeat will be listened to. Any test results from the previous visit will be discussed. Your health care provider may ask you: How you are feeling. If you are feeling the baby move. If you have had any abnormal symptoms, such as leaking fluid, bleeding, severe headaches, or abdominal cramping. If you have any questions. Other tests that may be performed during your second  trimester include: Blood tests that check for: Low iron levels (anemia). Gestational diabetes (between 24 and 28 weeks). Rh antibodies. Urine tests to check for infections, diabetes, or protein in the urine. An ultrasound to confirm the proper growth and development of the baby. An amniocentesis to check for possible genetic problems. Fetal screens for spina bifida and Down syndrome. HOME CARE INSTRUCTIONS  Avoid all smoking, herbs, alcohol, and unprescribed drugs. These chemicals affect the formation and growth of the baby. Follow your health care provider's instructions regarding medicine use. There are medicines that are either safe or unsafe to take during pregnancy. Exercise only as directed by your health care provider. Experiencing uterine cramps is a good sign to stop exercising. Continue to eat regular, healthy meals. Wear a good support bra for breast tenderness. Do not use hot tubs, steam rooms, or saunas. Wear your seat belt at all times when driving. Avoid raw meat, uncooked cheese, cat litter boxes, and soil used by cats. These carry germs that can cause birth defects in the baby. Take your prenatal vitamins. Try taking a stool softener (if your health care provider approves) if you develop constipation. Eat more high-fiber foods, such as fresh vegetables or fruit and whole grains. Drink plenty of fluids to keep your urine clear or pale yellow. Take warm sitz baths to soothe any pain or discomfort caused by hemorrhoids. Use hemorrhoid cream if your health care provider approves. If you develop varicose veins, wear support hose. Elevate your feet for 15 minutes, 3-4 times a day. Limit salt in your diet. Avoid heavy lifting, wear low heel shoes, and practice good posture. Rest with your legs elevated if you have leg cramps or low back pain. Visit your dentist if you have not gone yet during your pregnancy. Use a soft toothbrush to brush your teeth and be gentle when you floss. A  sexual relationship may be continued unless your health care provider directs you otherwise. Continue to go to all your prenatal visits as directed by your health care provider. SEEK MEDICAL CARE IF:  You have dizziness. You have mild pelvic cramps, pelvic pressure, or nagging pain in the abdominal area. You have persistent nausea, vomiting, or diarrhea. You have a bad smelling vaginal discharge. You have pain with urination. SEEK IMMEDIATE MEDICAL CARE IF:  You have a fever. You are leaking fluid from your vagina. You have spotting or bleeding from your vagina. You have severe abdominal cramping or pain. You have rapid weight gain or loss. You have shortness of breath with chest pain. You  notice sudden or extreme swelling of your face, hands, ankles, feet, or legs. You have not felt your baby move in over an hour. You have severe headaches that do not go away with medicine. You have vision changes. Document Released: 06/01/2001 Document Revised: 06/12/2013 Document Reviewed: 08/08/2012 [facility] Patient Information 2015 [place], [place]. This information is not intended to replace advice given to you by your health care provider. Make sure you discuss any questions you have with your health care provider.

## 2023-06-22 NOTE — L&D Delivery Note (Signed)
   Delivery Note:   G1P0000 at 106w0d  Admitting diagnosis: Premature rupture of membranes [O42.90] Risks:  Patient Active Problem List   Diagnosis Date Noted   Premature rupture of membranes 09/12/2023   UTI (urinary tract infection) during pregnancy, second trimester 07/11/2023   Gestational diabetes 07/07/2023   Alpha thalassemia silent carrier 04/04/2023   GBS bacteriuria 03/24/2023   Supervision of high risk pregnancy, antepartum 03/18/2023     First Stage:  Induction of labor:N/A  Onset of labor: SROM @ 0315 Augmentation: Pitocin ROM: SROM @ 0315  Active labor onset: 0315 Analgesia /Anesthesia/Pain control intrapartum: Epidural  Second Stage:  Complete dilation at   09/12/23 1645 Onset of pushing at 1645 FHR second stage Cat II. Moderate variability. Accels present no decels.    Shelly Mckenzie called to patient bedside. Patient Pushing in lithotomy position with Shelly Mckenzie and L&D staff support at bedside. FOB and Family present for birth and supportive.   Delivery of a Live born female  Birth Weight:PENDING APGAR: 26, 27  Newborn Delivery   Birth date/time: 09/12/2023 17:09:00 Delivery type: Vaginal, Spontaneous    With maternal pushing efforts Fetal head delivered in cephalic presentation, DOA position and spontaneously restituted to LOT position. Compound hand presentation and remaining fetal body delivered with ease. Vigorously crying infant placed immediately skin to skin.  Nuchal Cord: No    After several mins of life cord double clamped after cessation of pulsation, and cut by FOB.  Collection of cord blood for typing completed. Cord blood donation-None Arterial cord blood sample-No   Third Stage:  With gentle cord traction and maternal pushing efforts Placenta delivered-Spontaneous with 3 vessels. Uterine tone firm bleeding minimal  Uterotonics: IV pit bolus  Placenta to L&D for disposal.  1st degree;Perineal;Periurethral laceration identified.   Episiotomy:None Local analgesia: N/A   Repair:2nd degree periurethral, repaired to hemostasis using a 3.0 vicryl. 3 hemostatic abrasions upper periurethral. Left one repaired   Est. Blood Loss (mL):0.00  Complications: None   Mom to postpartum.  Baby girl " Shelly Mckenzie" to Couplet care / Skin to Skin.  Delivery Report:  Review the Delivery Report for details.    Shelly Hur Danella Deis) [name], Shelly Mckenzie, Shelly Mckenzie  Center for [facility] Healthcare  09/12/23  5:34 PM

## 2023-07-06 ENCOUNTER — Encounter: Payer: Self-pay | Admitting: Women's Health

## 2023-07-06 ENCOUNTER — Ambulatory Visit: Payer: Medicaid Other | Admitting: Women's Health

## 2023-07-06 ENCOUNTER — Other Ambulatory Visit: Payer: Medicaid Other

## 2023-07-06 VITALS — BP 117/76 | HR 73 | Wt 202.0 lb

## 2023-07-06 DIAGNOSIS — Z23 Encounter for immunization: Secondary | ICD-10-CM

## 2023-07-06 DIAGNOSIS — Z3A27 27 weeks gestation of pregnancy: Secondary | ICD-10-CM | POA: Diagnosis not present

## 2023-07-06 DIAGNOSIS — Z3A26 26 weeks gestation of pregnancy: Secondary | ICD-10-CM

## 2023-07-06 DIAGNOSIS — Z331 Pregnant state, incidental: Secondary | ICD-10-CM

## 2023-07-06 DIAGNOSIS — R350 Frequency of micturition: Secondary | ICD-10-CM

## 2023-07-06 DIAGNOSIS — Z1389 Encounter for screening for other disorder: Secondary | ICD-10-CM

## 2023-07-06 DIAGNOSIS — O2342 Unspecified infection of urinary tract in pregnancy, second trimester: Secondary | ICD-10-CM | POA: Diagnosis not present

## 2023-07-06 DIAGNOSIS — Z3402 Encounter for supervision of normal first pregnancy, second trimester: Secondary | ICD-10-CM

## 2023-07-06 DIAGNOSIS — Z131 Encounter for screening for diabetes mellitus: Secondary | ICD-10-CM

## 2023-07-06 LAB — POCT URINALYSIS DIPSTICK OB
Blood, UA: NEGATIVE
Glucose, UA: NEGATIVE
Ketones, UA: NEGATIVE
Leukocytes, UA: NEGATIVE
Nitrite, UA: NEGATIVE
POC,PROTEIN,UA: NEGATIVE

## 2023-07-06 NOTE — Patient Instructions (Signed)
Shelly Mckenzie, thank you for choosing our office today! We appreciate the opportunity to meet your healthcare needs. You may receive a short survey by mail, e-mail, or through [employer]. If you are happy with your care we would appreciate if you could take just a few minutes to complete the survey questions. We read all of your comments and take your feedback very seriously. Thank you again for choosing our office.  Center for [employer] Team at [facility]  [facility] ([address] [place], [place] [address]) Entrance C, located off of [address] Free 24/7 valet parking   CLASSES: Go to [email or web address] to register for classes (childbirth, breastfeeding, waterbirth, infant CPR, daddy bootcamp, etc.)  Call the office [phone]) or go to [facility] if: You begin to have strong, frequent contractions Your water breaks.  Sometimes it is a big gush of fluid, sometimes it is just a trickle that keeps getting your panties wet or running down your legs You have vaginal bleeding.  It is normal to have a small amount of spotting if your cervix was checked.  You don't feel your baby moving like normal.  If you don't, get you something to eat and drink and lay down and focus on feeling your baby move.   If your baby is still not moving like normal, you should call the office or go to [facility].  Call the office [phone]) or go to [facility] hospital for these signs of pre-eclampsia: Severe headache that does not go away with Tylenol Visual changes- seeing spots, double, blurred vision Pain under your right breast or upper abdomen that does not go away with Tums or heartburn medicine Nausea and/or vomiting Severe swelling in your hands, feet, and face   Tdap Vaccine It is recommended that you get the Tdap vaccine during the third trimester of EACH pregnancy to help protect your baby from getting pertussis (whooping cough) 27-36 weeks is the BEST time to do  this so that you can pass the protection on to your baby. During pregnancy is better than after pregnancy, but if you are unable to get it during pregnancy it will be offered at the hospital.  You can get this vaccine with [place], at the health department, your family doctor, or some local pharmacies Everyone who will be around your baby should also be up-to-date on their vaccines before the baby comes. Adults (who are not pregnant) only need 1 dose of Tdap during adulthood.   [facility] Pediatricians/Family Doctors [facility]): [address] Colette Ribas, [phone]           [facility]: [address], [phone]                [facility] Medicine [facility]): [address], [phone] (call to ask if accepting patients) [facility] Department: [address], [place], [phone]    [facility] Pediatricians/Family Doctors [facility] [place] [facility]): [phone], [phone] [facility]: [address] [place], [phone] [facility]: [address], [phone]  [facility] Doctors  [facility]): [phone] [facility]: [address] [phone]   [facility] Doctors [facility]: [address], [phone]  [facility] Family Doctors  [facility]: [phone], [phone]  Home Blood Pressure Monitoring for Patients   Your provider has recommended that you check your  blood pressure (BP) at least once a week at home. If you do not have a blood pressure cuff at home, one will be provided for you. Contact your provider if you have not received your monitor within 1 week.   Helpful Tips for Accurate Home Blood Pressure Checks  Don't smoke, exercise, or drink caffeine 30 minutes before checking your BP Use the restroom before checking your BP (a full bladder can raise your  pressure) Relax in a comfortable upright chair Feet on the ground Left arm resting comfortably on a flat surface at the level of your heart Legs uncrossed Back supported Sit quietly and don't talk Place the cuff on your bare arm Adjust snuggly, so that only two fingertips can fit between your skin and the top of the cuff Check 2 readings separated by at least one minute Keep a log of your BP readings For a visual, please reference this diagram: http://ccnc.care/bpdiagram  Provider Name: [facility]     Phone: [phone]  Zone 1: ALL CLEAR  Continue to monitor your symptoms:  BP reading is less than 140 (top number) or less than 90 (bottom number)  No right upper stomach pain No headaches or seeing spots No feeling nauseated or throwing up No swelling in face and hands  Zone 2: CAUTION Call your doctor's office for any of the following:  BP reading is greater than 140 (top number) or greater than 90 (bottom number)  Stomach pain under your ribs in the middle or right side Headaches or seeing spots Feeling nauseated or throwing up Swelling in face and hands  Zone 3: EMERGENCY  Seek immediate medical care if you have any of the following:  BP reading is greater than160 (top number) or greater than 110 (bottom number) Severe headaches not improving with Tylenol Serious difficulty catching your breath Any worsening symptoms from Zone 2   Third Trimester of Pregnancy The third trimester is from week 29 through week 42, months 7 through 9. The third trimester is a time when the fetus is growing rapidly. At the end of the ninth month, the fetus is about 20 inches in length and weighs 6-10 pounds.  BODY CHANGES Your body goes through many changes during pregnancy. The changes vary from woman to woman.  Your weight will continue to increase. You can expect to gain 25-35 pounds (11-16 kg) by the end of the pregnancy. You may begin to get stretch marks on your hips, abdomen,  and breasts. You may urinate more often because the fetus is moving lower into your pelvis and pressing on your bladder. You may develop or continue to have heartburn as a result of your pregnancy. You may develop constipation because certain hormones are causing the muscles that push waste through your intestines to slow down. You may develop hemorrhoids or swollen, bulging veins (varicose veins). You may have pelvic pain because of the weight gain and pregnancy hormones relaxing your joints between the bones in your pelvis. Backaches may result from overexertion of the muscles supporting your posture. You may have changes in your hair. These can include thickening of your hair, rapid growth, and changes in texture. Some women also have hair loss during or after pregnancy, or hair that feels dry or thin. Your hair will most likely return to normal after your baby is born. Your breasts will continue to grow and be tender. A yellow discharge may leak from your breasts called colostrum. Your belly button may stick out. You may  feel short of breath because of your expanding uterus. You may notice the fetus "dropping," or moving lower in your abdomen. You may have a bloody mucus discharge. This usually occurs a few days to a week before labor begins. Your cervix becomes thin and soft (effaced) near your due date. WHAT TO EXPECT AT YOUR PRENATAL EXAMS  You will have prenatal exams every 2 weeks until week 36. Then, you will have weekly prenatal exams. During a routine prenatal visit: You will be weighed to make sure you and the fetus are growing normally. Your blood pressure is taken. Your abdomen will be measured to track your baby's growth. The fetal heartbeat will be listened to. Any test results from the previous visit will be discussed. You may have a cervical check near your due date to see if you have effaced. At around 36 weeks, your caregiver will check your cervix. At the same time, your  caregiver will also perform a test on the secretions of the vaginal tissue. This test is to determine if a type of bacteria, Group B streptococcus, is present. Your caregiver will explain this further. Your caregiver may ask you: What your birth plan is. How you are feeling. If you are feeling the baby move. If you have had any abnormal symptoms, such as leaking fluid, bleeding, severe headaches, or abdominal cramping. If you have any questions. Other tests or screenings that may be performed during your third trimester include: Blood tests that check for low iron levels (anemia). Fetal testing to check the health, activity level, and growth of the fetus. Testing is done if you have certain medical conditions or if there are problems during the pregnancy. FALSE LABOR You may feel small, irregular contractions that eventually go away. These are called Braxton Hicks contractions, or false labor. Contractions may last for hours, days, or even weeks before true labor sets in. If contractions come at regular intervals, intensify, or become painful, it is best to be seen by your caregiver.  SIGNS OF LABOR  Menstrual-like cramps. Contractions that are 5 minutes apart or less. Contractions that start on the top of the uterus and spread down to the lower abdomen and back. A sense of increased pelvic pressure or back pain. A watery or bloody mucus discharge that comes from the vagina. If you have any of these signs before the 37th week of pregnancy, call your caregiver right away. You need to go to the hospital to get checked immediately. HOME CARE INSTRUCTIONS  Avoid all smoking, herbs, alcohol, and unprescribed drugs. These chemicals affect the formation and growth of the baby. Follow your caregiver's instructions regarding medicine use. There are medicines that are either safe or unsafe to take during pregnancy. Exercise only as directed by your caregiver. Experiencing uterine cramps is a good sign to  stop exercising. Continue to eat regular, healthy meals. Wear a good support bra for breast tenderness. Do not use hot tubs, steam rooms, or saunas. Wear your seat belt at all times when driving. Avoid raw meat, uncooked cheese, cat litter boxes, and soil used by cats. These carry germs that can cause birth defects in the baby. Take your prenatal vitamins. Try taking a stool softener (if your caregiver approves) if you develop constipation. Eat more high-fiber foods, such as fresh vegetables or fruit and whole grains. Drink plenty of fluids to keep your urine clear or pale yellow. Take warm sitz baths to soothe any pain or discomfort caused by hemorrhoids. Use hemorrhoid cream if  your caregiver approves. If you develop varicose veins, wear support hose. Elevate your feet for 15 minutes, 3-4 times a day. Limit salt in your diet. Avoid heavy lifting, wear low heal shoes, and practice good posture. Rest a lot with your legs elevated if you have leg cramps or low back pain. Visit your dentist if you have not gone during your pregnancy. Use a soft toothbrush to brush your teeth and be gentle when you floss. A sexual relationship may be continued unless your caregiver directs you otherwise. Do not travel far distances unless it is absolutely necessary and only with the approval of your caregiver. Take prenatal classes to understand, practice, and ask questions about the labor and delivery. Make a trial run to the hospital. Pack your hospital bag. Prepare the baby's nursery. Continue to go to all your prenatal visits as directed by your caregiver. SEEK MEDICAL CARE IF: You are unsure if you are in labor or if your water has broken. You have dizziness. You have mild pelvic cramps, pelvic pressure, or nagging pain in your abdominal area. You have persistent nausea, vomiting, or diarrhea. You have a bad smelling vaginal discharge. You have pain with urination. SEEK IMMEDIATE MEDICAL CARE IF:  You  have a fever. You are leaking fluid from your vagina. You have spotting or bleeding from your vagina. You have severe abdominal cramping or pain. You have rapid weight loss or gain. You have shortness of breath with chest pain. You notice sudden or extreme swelling of your face, hands, ankles, feet, or legs. You have not felt your baby move in over an hour. You have severe headaches that do not go away with medicine. You have vision changes. Document Released: 06/01/2001 Document Revised: 06/12/2013 Document Reviewed: 08/08/2012 [facility] Patient Information 2015 [place], [place]. This information is not intended to replace advice given to you by your health care provider. Make sure you discuss any questions you have with your health care provider.

## 2023-07-06 NOTE — Progress Notes (Signed)
 LOW-RISK PREGNANCY VISIT Patient name: Shelly Mckenzie MRN [id number]  Date of birth: 12-21-2000 Chief Complaint:   Routine Prenatal Visit (PN2. Tdap information given)  History of Present Illness:   Shelly Mckenzie is a 23 y.o. G20P0000 female at [redacted]w[redacted]d with an Estimated Date of Delivery: 10/03/23 being seen today for ongoing management of a low-risk pregnancy.   Today she reports  peeing a little more for last week or 2. No burning/discomfort. . Contractions: Not present.  .  Movement: Present. denies leaking of fluid.     07/06/2023    8:50 AM 03/22/2023   10:14 AM 07/05/2022    2:16 PM  Depression screen PHQ 2/9  Decreased Interest 0 0 0  Down, Depressed, Hopeless 0 0 0  PHQ - 2 Score 0 0 0  Altered sleeping 1 0 0  Tired, decreased energy 1 1 0  Change in appetite 0 0 0  Feeling bad or failure about yourself  0 0 0  Trouble concentrating 0 0 0  Moving slowly or fidgety/restless 0 0 0  Suicidal thoughts 0 0 0  PHQ-9 Score 2 1 0        03/22/2023   10:14 AM 07/05/2022    2:16 PM  GAD 7 : Generalized Anxiety Score  Nervous, Anxious, on Edge 0 0  Control/stop worrying 0 0  Worry too much - different things 0 0  Trouble relaxing 0 0  Restless 0 0  Easily annoyed or irritable 0 0  Afraid - awful might happen 0 0  Total GAD 7 Score 0 0      Review of Systems:   Pertinent items are noted in HPI Denies abnormal vaginal discharge w/ itching/odor/irritation, headaches, visual changes, shortness of breath, chest pain, abdominal pain, severe nausea/vomiting, or problems with urination or bowel movements unless otherwise stated above. Pertinent History Reviewed:  Reviewed past medical,surgical, social, obstetrical and family history.  Reviewed problem list, medications and allergies. Physical Assessment:   Vitals:   07/06/23 0845  BP: 117/76  Pulse: 73  Weight: 202 lb (91.6 kg)  Body mass index is 38.17 kg/m.        Physical Examination:   General appearance: Well  appearing, and in no distress  Mental status: Alert, oriented to person, place, and time  Skin: Warm & dry  Cardiovascular: Normal heart rate noted  Respiratory: Normal respiratory effort, no distress  Abdomen: Soft, gravid, nontender  Pelvic: Cervical exam deferred         Extremities: Edema: None  Fetal Status: Fetal Heart Rate (bpm): 150 Fundal Height: 28 cm Movement: Present    Chaperone: N/A   Results for orders placed or performed in visit on 07/06/23 (from the past 24 hours)  POC Urinalysis Dipstick OB   Collection Time: 07/06/23  9:24 AM  Result Value Ref Range   Color, UA     Clarity, UA     Glucose, UA Negative Negative   Bilirubin, UA     Ketones, UA negative    Spec Grav, UA     Blood, UA negative    pH, UA     POC,PROTEIN,UA Negative Negative, Trace, Small (1+), Moderate (2+), Large (3+), 4+   Urobilinogen, UA     Nitrite, UA negative    Leukocytes, UA Negative Negative   Appearance     Odor      Assessment & Plan:  1) Low-risk pregnancy G1P0000 at [redacted]w[redacted]d with an Estimated Date of Delivery: 10/03/23  2) Slightly increased urinary frequency, no other sx, urine dipstick neg, send urine cx  3) PG BMI 37- plan EFW @ 32w   Meds: No orders of the defined types were placed in this encounter.  Labs/procedures today: Tdap and PN2, urine cx  Plan:  Continue routine obstetrical care  Next visit: prefers in person    Reviewed: Preterm labor symptoms and general obstetric precautions including but not limited to vaginal bleeding, contractions, leaking of fluid and fetal movement were reviewed in detail with the patient.  All questions were answered. Does have home bp cuff. Office bp cuff given: not applicable. Check bp weekly, let us  know if consistently >140 and/or >90.  Follow-up: Return in about 3 weeks (around 07/27/2023) for LROB, CNM, in person; then 5wks from now EFW u/s and LROB w/ CNM.  Future Appointments  Date Time Provider Department Center  07/27/2023  8:30  AM Jolayne Natter, CNM [facility]  08/10/2023  8:30 AM [facility]  [facility] None  08/10/2023  9:50 AM Ferd Householder, CNM [facility]    Orders Placed This Encounter  Procedures   Urine Culture   Tdap vaccine greater than or equal to 7yo IM   POC Urinalysis Dipstick OB   Ferd Householder CNM, [facility] 07/06/2023 9:37 AM

## 2023-07-07 ENCOUNTER — Encounter: Payer: Self-pay | Admitting: Women's Health

## 2023-07-07 ENCOUNTER — Other Ambulatory Visit: Payer: Self-pay | Admitting: Women's Health

## 2023-07-07 DIAGNOSIS — O24410 Gestational diabetes mellitus in pregnancy, diet controlled: Secondary | ICD-10-CM

## 2023-07-07 DIAGNOSIS — Z8632 Personal history of gestational diabetes: Secondary | ICD-10-CM | POA: Insufficient documentation

## 2023-07-07 DIAGNOSIS — O24419 Gestational diabetes mellitus in pregnancy, unspecified control: Secondary | ICD-10-CM | POA: Insufficient documentation

## 2023-07-07 LAB — CBC
Hematocrit: 34.4 % (ref 34.0–46.6)
Hemoglobin: 11.1 g/dL (ref 11.1–15.9)
MCH: 26.9 pg (ref 26.6–33.0)
MCHC: 32.3 g/dL (ref 31.5–35.7)
MCV: 83 fL (ref 79–97)
Platelets: 398 10*3/uL (ref 150–450)
RBC: 4.13 x10E6/uL (ref 3.77–5.28)
RDW: 12.2 % (ref 11.7–15.4)
WBC: 10.1 10*3/uL (ref 3.4–10.8)

## 2023-07-07 LAB — RPR: RPR Ser Ql: NONREACTIVE

## 2023-07-07 LAB — GLUCOSE TOLERANCE, 2 HOURS W/ 1HR
Glucose, 1 hour: 151 mg/dL (ref 70–179)
Glucose, 2 hour: 110 mg/dL (ref 70–152)
Glucose, Fasting: 92 mg/dL — ABNORMAL HIGH (ref 70–91)

## 2023-07-07 LAB — ANTIBODY SCREEN: Antibody Screen: NEGATIVE

## 2023-07-07 LAB — HIV ANTIBODY (ROUTINE TESTING W REFLEX): HIV Screen 4th Generation wRfx: NONREACTIVE

## 2023-07-07 MED ORDER — ACCU-CHEK GUIDE ME W/DEVICE KIT: 1.0000 | PACK | Freq: Four times a day (QID) | 0 refills | Status: DC

## 2023-07-07 MED ORDER — ACCU-CHEK SOFTCLIX LANCETS MISC: 12 refills | Status: DC

## 2023-07-07 MED ORDER — GLUCOSE BLOOD VI STRP: ORAL_STRIP | 12 refills | Status: DC

## 2023-07-09 LAB — URINE CULTURE

## 2023-07-11 ENCOUNTER — Encounter: Payer: Self-pay | Admitting: Women's Health

## 2023-07-11 DIAGNOSIS — O2342 Unspecified infection of urinary tract in pregnancy, second trimester: Secondary | ICD-10-CM | POA: Insufficient documentation

## 2023-07-11 MED ORDER — NITROFURANTOIN MONOHYD MACRO 100 MG PO CAPS: 100.0000 mg | ORAL_CAPSULE | Freq: Two times a day (BID) | ORAL | 0 refills | Status: DC

## 2023-07-11 NOTE — Addendum Note (Signed)
Addended by: Cheral Marker on: 07/11/2023 09:24 AM   Modules accepted: Orders

## 2023-07-13 ENCOUNTER — Ambulatory Visit: Payer: Medicaid Other | Admitting: Nutrition

## 2023-07-13 ENCOUNTER — Encounter: Payer: Self-pay | Admitting: Women's Health

## 2023-07-13 ENCOUNTER — Telehealth: Payer: Self-pay | Admitting: Nutrition

## 2023-07-13 NOTE — Telephone Encounter (Signed)
Vm to call and r/s missed appt. 

## 2023-07-27 ENCOUNTER — Ambulatory Visit (INDEPENDENT_AMBULATORY_CARE_PROVIDER_SITE_OTHER): Payer: Medicaid Other | Admitting: Advanced Practice Midwife

## 2023-07-27 ENCOUNTER — Encounter: Payer: Self-pay | Admitting: Advanced Practice Midwife

## 2023-07-27 VITALS — BP 130/76 | HR 86 | Wt 204.0 lb

## 2023-07-27 DIAGNOSIS — O0993 Supervision of high risk pregnancy, unspecified, third trimester: Secondary | ICD-10-CM

## 2023-07-27 DIAGNOSIS — Z5189 Encounter for other specified aftercare: Secondary | ICD-10-CM

## 2023-07-27 DIAGNOSIS — O0990 Supervision of high risk pregnancy, unspecified, unspecified trimester: Secondary | ICD-10-CM

## 2023-07-27 DIAGNOSIS — Z3A30 30 weeks gestation of pregnancy: Secondary | ICD-10-CM | POA: Diagnosis not present

## 2023-07-27 DIAGNOSIS — O2342 Unspecified infection of urinary tract in pregnancy, second trimester: Secondary | ICD-10-CM | POA: Diagnosis not present

## 2023-07-27 MED ORDER — BLOOD PRESSURE MONITOR MISC: 0 refills | Status: DC

## 2023-07-27 NOTE — Patient Instructions (Signed)
L[place], thank you for choosing our office today! We appreciate the opportunity to meet your healthcare needs. You may receive a short survey by mail, e-mail, or through [employer]. If you are happy with your care we would appreciate if you could take just a few minutes to complete the survey questions. We read all of your comments and take your feedback very seriously. Thank you again for choosing our office.  Center for [employer] Team at [facility]  [facility] ([address] [place], [place] [address]) Entrance C, located off of [address] Free 24/7 valet parking   CLASSES: Go to [email or web address] to register for classes (childbirth, breastfeeding, waterbirth, infant CPR, daddy bootcamp, etc.)  Call the office [phone]) or go to [facility] if: You begin to have strong, frequent contractions Your water breaks.  Sometimes it is a big gush of fluid, sometimes it is just a trickle that keeps getting your panties wet or running down your legs You have vaginal bleeding.  It is normal to have a small amount of spotting if your cervix was checked.  You don't feel your baby moving like normal.  If you don't, get you something to eat and drink and lay down and focus on feeling your baby move.   If your baby is still not moving like normal, you should call the office or go to [facility].  Call the office [phone]) or go to [facility] hospital for these signs of pre-eclampsia: Severe headache that does not go away with Tylenol Visual changes- seeing spots, double, blurred vision Pain under your right breast or upper abdomen that does not go away with Tums or heartburn medicine Nausea and/or vomiting Severe swelling in your hands, feet, and face   Tdap Vaccine It is recommended that you get the Tdap vaccine during the third trimester of EACH pregnancy to help protect your baby from getting pertussis (whooping cough) 27-36 weeks is the BEST time to do  this so that you can pass the protection on to your baby. During pregnancy is better than after pregnancy, but if you are unable to get it during pregnancy it will be offered at the hospital.  You can get this vaccine with [place], at the health department, your family doctor, or some local pharmacies Everyone who will be around your baby should also be up-to-date on their vaccines before the baby comes. Adults (who are not pregnant) only need 1 dose of Tdap during adulthood.   [facility] Pediatricians/Family Doctors [facility]): [address] Colette Ribas, [phone]           [facility]: [address], [phone]                [facility] Medicine [facility]): [address], [phone] (call to ask if accepting patients) [facility] Department: [address], [place], [phone]    [facility] Pediatricians/Family Doctors [facility] [place] [facility]): [phone], [phone] [facility]: [address] [place], [phone] [facility]: [address], [phone]  [facility] Doctors  [facility]): [phone] [facility]: [address] [phone]   [facility] Doctors [facility]: [address], [phone]  [facility] Family Doctors  [facility]: [phone], [phone]  Home Blood Pressure Monitoring for Patients   Your provider has recommended that you check your  blood pressure (BP) at least once a week at home. If you do not have a blood pressure cuff at home, one will be provided for you. Contact your provider if you have not received your monitor within 1 week.   Helpful Tips for Accurate Home Blood Pressure Checks  Don't smoke, exercise, or drink caffeine 30 minutes before checking your BP Use the restroom before checking your BP (a full bladder can raise your  pressure) Relax in a comfortable upright chair Feet on the ground Left arm resting comfortably on a flat surface at the level of your heart Legs uncrossed Back supported Sit quietly and don't talk Place the cuff on your bare arm Adjust snuggly, so that only two fingertips can fit between your skin and the top of the cuff Check 2 readings separated by at least one minute Keep a log of your BP readings For a visual, please reference this diagram: http://ccnc.care/bpdiagram  Provider Name: [facility]     Phone: [phone]  Zone 1: ALL CLEAR  Continue to monitor your symptoms:  BP reading is less than 140 (top number) or less than 90 (bottom number)  No right upper stomach pain No headaches or seeing spots No feeling nauseated or throwing up No swelling in face and hands  Zone 2: CAUTION Call your doctor's office for any of the following:  BP reading is greater than 140 (top number) or greater than 90 (bottom number)  Stomach pain under your ribs in the middle or right side Headaches or seeing spots Feeling nauseated or throwing up Swelling in face and hands  Zone 3: EMERGENCY  Seek immediate medical care if you have any of the following:  BP reading is greater than160 (top number) or greater than 110 (bottom number) Severe headaches not improving with Tylenol Serious difficulty catching your breath Any worsening symptoms from Zone 2   Third Trimester of Pregnancy The third trimester is from week 29 through week 42, months 7 through 9. The third trimester is a time when the fetus is growing rapidly. At the end of the ninth month, the fetus is about 20 inches in length and weighs 6-10 pounds.  BODY CHANGES Your body goes through many changes during pregnancy. The changes vary from woman to woman.  Your weight will continue to increase. You can expect to gain 25-35 pounds (11-16 kg) by the end of the pregnancy. You may begin to get stretch marks on your hips, abdomen,  and breasts. You may urinate more often because the fetus is moving lower into your pelvis and pressing on your bladder. You may develop or continue to have heartburn as a result of your pregnancy. You may develop constipation because certain hormones are causing the muscles that push waste through your intestines to slow down. You may develop hemorrhoids or swollen, bulging veins (varicose veins). You may have pelvic pain because of the weight gain and pregnancy hormones relaxing your joints between the bones in your pelvis. Backaches may result from overexertion of the muscles supporting your posture. You may have changes in your hair. These can include thickening of your hair, rapid growth, and changes in texture. Some women also have hair loss during or after pregnancy, or hair that feels dry or thin. Your hair will most likely return to normal after your baby is born. Your breasts will continue to grow and be tender. A yellow discharge may leak from your breasts called colostrum. Your belly button may stick out. You may  feel short of breath because of your expanding uterus. You may notice the fetus "dropping," or moving lower in your abdomen. You may have a bloody mucus discharge. This usually occurs a few days to a week before labor begins. Your cervix becomes thin and soft (effaced) near your due date. WHAT TO EXPECT AT YOUR PRENATAL EXAMS  You will have prenatal exams every 2 weeks until week 36. Then, you will have weekly prenatal exams. During a routine prenatal visit: You will be weighed to make sure you and the fetus are growing normally. Your blood pressure is taken. Your abdomen will be measured to track your baby's growth. The fetal heartbeat will be listened to. Any test results from the previous visit will be discussed. You may have a cervical check near your due date to see if you have effaced. At around 36 weeks, your caregiver will check your cervix. At the same time, your  caregiver will also perform a test on the secretions of the vaginal tissue. This test is to determine if a type of bacteria, Group B streptococcus, is present. Your caregiver will explain this further. Your caregiver may ask you: What your birth plan is. How you are feeling. If you are feeling the baby move. If you have had any abnormal symptoms, such as leaking fluid, bleeding, severe headaches, or abdominal cramping. If you have any questions. Other tests or screenings that may be performed during your third trimester include: Blood tests that check for low iron levels (anemia). Fetal testing to check the health, activity level, and growth of the fetus. Testing is done if you have certain medical conditions or if there are problems during the pregnancy. FALSE LABOR You may feel small, irregular contractions that eventually go away. These are called Braxton Hicks contractions, or false labor. Contractions may last for hours, days, or even weeks before true labor sets in. If contractions come at regular intervals, intensify, or become painful, it is best to be seen by your caregiver.  SIGNS OF LABOR  Menstrual-like cramps. Contractions that are 5 minutes apart or less. Contractions that start on the top of the uterus and spread down to the lower abdomen and back. A sense of increased pelvic pressure or back pain. A watery or bloody mucus discharge that comes from the vagina. If you have any of these signs before the 37th week of pregnancy, call your caregiver right away. You need to go to the hospital to get checked immediately. HOME CARE INSTRUCTIONS  Avoid all smoking, herbs, alcohol, and unprescribed drugs. These chemicals affect the formation and growth of the baby. Follow your caregiver's instructions regarding medicine use. There are medicines that are either safe or unsafe to take during pregnancy. Exercise only as directed by your caregiver. Experiencing uterine cramps is a good sign to  stop exercising. Continue to eat regular, healthy meals. Wear a good support bra for breast tenderness. Do not use hot tubs, steam rooms, or saunas. Wear your seat belt at all times when driving. Avoid raw meat, uncooked cheese, cat litter boxes, and soil used by cats. These carry germs that can cause birth defects in the baby. Take your prenatal vitamins. Try taking a stool softener (if your caregiver approves) if you develop constipation. Eat more high-fiber foods, such as fresh vegetables or fruit and whole grains. Drink plenty of fluids to keep your urine clear or pale yellow. Take warm sitz baths to soothe any pain or discomfort caused by hemorrhoids. Use hemorrhoid cream if  your caregiver approves. If you develop varicose veins, wear support hose. Elevate your feet for 15 minutes, 3-4 times a day. Limit salt in your diet. Avoid heavy lifting, wear low heal shoes, and practice good posture. Rest a lot with your legs elevated if you have leg cramps or low back pain. Visit your dentist if you have not gone during your pregnancy. Use a soft toothbrush to brush your teeth and be gentle when you floss. A sexual relationship may be continued unless your caregiver directs you otherwise. Do not travel far distances unless it is absolutely necessary and only with the approval of your caregiver. Take prenatal classes to understand, practice, and ask questions about the labor and delivery. Make a trial run to the hospital. Pack your hospital bag. Prepare the baby's nursery. Continue to go to all your prenatal visits as directed by your caregiver. SEEK MEDICAL CARE IF: You are unsure if you are in labor or if your water has broken. You have dizziness. You have mild pelvic cramps, pelvic pressure, or nagging pain in your abdominal area. You have persistent nausea, vomiting, or diarrhea. You have a bad smelling vaginal discharge. You have pain with urination. SEEK IMMEDIATE MEDICAL CARE IF:  You  have a fever. You are leaking fluid from your vagina. You have spotting or bleeding from your vagina. You have severe abdominal cramping or pain. You have rapid weight loss or gain. You have shortness of breath with chest pain. You notice sudden or extreme swelling of your face, hands, ankles, feet, or legs. You have not felt your baby move in over an hour. You have severe headaches that do not go away with medicine. You have vision changes. Document Released: 06/01/2001 Document Revised: 06/12/2013 Document Reviewed: 08/08/2012 [facility] Patient Information 2015 [place], [place]. This information is not intended to replace advice given to you by your health care provider. Make sure you discuss any questions you have with your health care provider.

## 2023-07-27 NOTE — Addendum Note (Signed)
 Addended by: Myrl Askew on: 07/27/2023 09:07 AM   Modules accepted: Orders

## 2023-07-27 NOTE — Progress Notes (Signed)
 HIGH-RISK PREGNANCY VISIT Patient name: Shelly Mckenzie MRN [id number]  Date of birth: August 21, 2000 Chief Complaint:   Routine Prenatal Visit (Urine culture)  History of Present Illness:   Shelly Mckenzie is a 23 y.o. G99P0000 female at [redacted]w[redacted]d with an Estimated Date of Delivery: 10/03/23 being seen today for ongoing management of a high-risk pregnancy complicated by diabetes mellitus A1DM.  Brought log: about 50% of fasting values <95 and almost all PP values <120. She reports soda being the culprit of the higher fasting values.  Today she reports  low abd pressure at the end of her work shift . Contractions: Not present.  .  Movement: Present. denies leaking of fluid.      07/06/2023    8:50 AM 03/22/2023   10:14 AM 07/05/2022    2:16 PM  Depression screen PHQ 2/9  Decreased Interest 0 0 0  Down, Depressed, Hopeless 0 0 0  PHQ - 2 Score 0 0 0  Altered sleeping 1 0 0  Tired, decreased energy 1 1 0  Change in appetite 0 0 0  Feeling bad or failure about yourself  0 0 0  Trouble concentrating 0 0 0  Moving slowly or fidgety/restless 0 0 0  Suicidal thoughts 0 0 0  PHQ-9 Score 2 1 0        03/22/2023   10:14 AM 07/05/2022    2:16 PM  GAD 7 : Generalized Anxiety Score  Nervous, Anxious, on Edge 0 0  Control/stop worrying 0 0  Worry too much - different things 0 0  Trouble relaxing 0 0  Restless 0 0  Easily annoyed or irritable 0 0  Afraid - awful might happen 0 0  Total GAD 7 Score 0 0     Review of Systems:   Pertinent items are noted in HPI Denies abnormal vaginal discharge w/ itching/odor/irritation, headaches, visual changes, shortness of breath, chest pain, abdominal pain, severe nausea/vomiting, or problems with urination or bowel movements unless otherwise stated above. Pertinent History Reviewed:  Reviewed past medical,surgical, social, obstetrical and family history.  Reviewed problem list, medications and allergies. Physical Assessment:   Vitals:   07/27/23 0829   BP: 130/76  Pulse: 86  Weight: 204 lb (92.5 kg)  Body mass index is 38.55 kg/m.           Physical Examination:   General appearance: alert, well appearing, and in no distress  Mental status: alert, oriented to person, place, and time  Skin: warm & dry   Extremities: Edema: None    Cardiovascular: normal heart rate noted  Respiratory: normal respiratory effort, no distress  Abdomen: gravid, soft, non-tender  Pelvic: Cervical exam deferred         Fetal Status: Fetal Heart Rate (bpm): 159 Fundal Height: 30 cm Movement: Present    Fetal Surveillance Testing today: doppler    No results found for this or any previous visit (from the past 24 hours).  Assessment & Plan:  High-risk pregnancy: G1P0000 at [redacted]w[redacted]d with an Estimated Date of Delivery: 10/03/23   1) A1GDM, encouraged to completely cut soda in an attempt to get fasting values <95; may need to start meds if unable to improve them; has growth with next visit and we will review sugar log  2) Recent UTI, completed Macrobid ; POC today  Meds: No orders of the defined types were placed in this encounter.   Labs/procedures today: urine culture  Treatment Plan:  growth @ 32 and 36wks; IOL 39-40 (  if remains off meds)  Reviewed: Preterm labor symptoms and general obstetric precautions including but not limited to vaginal bleeding, contractions, leaking of fluid and fetal movement were reviewed in detail with the patient.  All questions were answered. Does not have home bp cuff. Office bp cuff given: no (reordered today). Check bp weekly, let us  know if consistently >140 and/or >90.  Follow-up: Return for As scheduled.   Future Appointments  Date Time Provider Department Center  08/10/2023  8:30 AM [facility] US  [facility] None  08/10/2023  9:50 AM Kizzie Suzen SAUNDERS, CNM [facility]    Orders Placed This Encounter  Procedures   Urine Culture   Suzen JONETTA Gentry [facility] 07/27/2023 8:55 AM

## 2023-07-29 ENCOUNTER — Encounter: Payer: Self-pay | Admitting: Advanced Practice Midwife

## 2023-07-29 LAB — URINE CULTURE

## 2023-08-09 ENCOUNTER — Other Ambulatory Visit: Payer: Self-pay | Admitting: Advanced Practice Midwife

## 2023-08-09 DIAGNOSIS — O24410 Gestational diabetes mellitus in pregnancy, diet controlled: Secondary | ICD-10-CM

## 2023-08-10 ENCOUNTER — Encounter: Payer: Self-pay | Admitting: Women's Health

## 2023-08-10 ENCOUNTER — Ambulatory Visit: Payer: Medicaid Other | Admitting: Women's Health

## 2023-08-10 ENCOUNTER — Ambulatory Visit: Payer: Medicaid Other

## 2023-08-10 VITALS — BP 117/72 | HR 70 | Wt 204.4 lb

## 2023-08-10 DIAGNOSIS — O24410 Gestational diabetes mellitus in pregnancy, diet controlled: Secondary | ICD-10-CM

## 2023-08-10 DIAGNOSIS — Z3A32 32 weeks gestation of pregnancy: Secondary | ICD-10-CM

## 2023-08-10 DIAGNOSIS — O0993 Supervision of high risk pregnancy, unspecified, third trimester: Secondary | ICD-10-CM

## 2023-08-10 DIAGNOSIS — O0990 Supervision of high risk pregnancy, unspecified, unspecified trimester: Secondary | ICD-10-CM

## 2023-08-10 NOTE — Patient Instructions (Signed)
Shelly Mckenzie, thank you for choosing our office today! We appreciate the opportunity to meet your healthcare needs. You may receive a short survey by mail, e-mail, or through MyChart. If you are happy with your care we would appreciate if you could take just a few minutes to complete the survey questions. We read all of your comments and take your feedback very seriously. Thank you again for choosing our office.  [facility] ([address] [place], [place] [address]) Entrance C, located off of [address] Free 24/7 valet parking   CLASSES: Go to [email or web address] to register for classes (childbirth, breastfeeding, waterbirth, infant CPR, daddy bootcamp, etc.)  Call the office ([phone]) or go to [facility] if: You begin to have strong, frequent contractions Your water breaks.  Sometimes it is a big gush of fluid, sometimes it is just a trickle that keeps getting your panties wet or running down your legs You have vaginal bleeding.  It is normal to have a small amount of spotting if your cervix was checked.  You don't feel your baby moving like normal.  If you don't, get you something to eat and drink and lay down and focus on feeling your baby move.   If your baby is still not moving like normal, you should call the office or go to [facility].  Call the office ([phone]) or go to [facility] for these signs of pre-eclampsia: Severe headache that does not go away with Tylenol Visual changes- seeing spots, double, blurred vision Pain under your right breast or upper abdomen that does not go away with Tums or heartburn medicine Nausea and/or vomiting Severe swelling in your hands, feet, and face   Tdap Vaccine It is recommended that you get the Tdap vaccine during the third trimester of EACH pregnancy to help protect your baby from getting pertussis (whooping cough) 27-36 weeks is the BEST time to do  this so that you can pass the protection on to your baby. During pregnancy is better than after pregnancy, but if you are unable to get it during pregnancy it will be offered at the hospital.  You can get this vaccine with us, at the health department, your family doctor, or some local pharmacies Everyone who will be around your baby should also be up-to-date on their vaccines before the baby comes. Adults (who are not pregnant) only need 1 dose of Tdap during adulthood.   [place] Pediatricians/Family Doctors [facility] ([facility]): [address], [phone]           [facility]: [address], [phone]                [facility] ([facility]): [address], [phone] (call to ask if accepting patients) [facility]: [address], [place], [phone]    [place] Pediatricians/Family Doctors [facility] ([facility]): [address], [phone] [facility]: [address], [phone] [facility]: [address], [phone]  [place] [facility]  [facility] ([facility]): [phone] [facility]: [address], [phone]   [place] Family Doctors [facility]: [address], [phone]  [place] [facility]  [facility]: [address], [phone]  Home Blood Pressure Monitoring for Patients   Your provider has recommended that you check your   blood pressure (BP) at least once a week at home. If you do not have a blood pressure cuff at home, one will be provided for you. Contact your provider if you have not received your monitor within 1 week.   Helpful Tips for Accurate Home Blood Pressure Checks  Don't smoke, exercise, or drink caffeine 30 minutes before checking your BP Use the restroom before checking your BP (a full bladder can raise your  pressure) Relax in a comfortable upright chair Feet on the ground Left arm resting comfortably on a flat surface at the level of your heart Legs uncrossed Back supported Sit quietly and don't talk Place the cuff on your bare arm Adjust snuggly, so that only two fingertips can fit between your skin and the top of the cuff Check 2 readings separated by at least one minute Keep a log of your BP readings For a visual, please reference this diagram: http://ccnc.care/bpdiagram  Provider Name: [facility]     Phone: [phone]  Zone 1: ALL CLEAR  Continue to monitor your symptoms:  BP reading is less than 140 (top number) or less than 90 (bottom number)  No right upper stomach pain No headaches or seeing spots No feeling nauseated or throwing up No swelling in face and hands  Zone 2: CAUTION Call your doctor's office for any of the following:  BP reading is greater than 140 (top number) or greater than 90 (bottom number)  Stomach pain under your ribs in the middle or right side Headaches or seeing spots Feeling nauseated or throwing up Swelling in face and hands  Zone 3: EMERGENCY  Seek immediate medical care if you have any of the following:  BP reading is greater than160 (top number) or greater than 110 (bottom number) Severe headaches not improving with Tylenol Serious difficulty catching your breath Any worsening symptoms from Zone 2  Preterm Labor and Birth Information  The normal length of a pregnancy is 39-41 weeks. Preterm labor is when labor starts before 37 completed weeks of pregnancy. What are the risk factors for preterm labor? Preterm labor is more likely to occur in women who: Have certain infections during pregnancy such as a bladder infection, sexually transmitted infection, or infection inside the uterus (chorioamnionitis). Have a shorter-than-normal cervix. Have gone into preterm labor before. Have had surgery on their cervix. Are younger than age 17  or older than age 35. Are African American. Are pregnant with twins or multiple babies (multiple gestation). Take street drugs or smoke while pregnant. Do not gain enough weight while pregnant. Became pregnant shortly after having been pregnant. What are the symptoms of preterm labor? Symptoms of preterm labor include: Cramps similar to those that can happen during a menstrual period. The cramps may happen with diarrhea. Pain in the abdomen or lower back. Regular uterine contractions that may feel like tightening of the abdomen. A feeling of increased pressure in the pelvis. Increased watery or bloody mucus discharge from the vagina. Water breaking (ruptured amniotic sac). Why is it important to recognize signs of preterm labor? It is important to recognize signs of preterm labor because babies who are born prematurely may not be fully developed. This can put them at an increased risk for: Long-term (chronic) heart and lung problems. Difficulty immediately after birth with regulating body systems, including blood sugar, body temperature, heart rate, and breathing rate. Bleeding in the brain. Cerebral palsy. Learning difficulties. Death. These risks are highest for babies who are born before 34 weeks   of pregnancy. How is preterm labor treated? Treatment depends on the length of your pregnancy, your condition, and the health of your baby. It may involve: Having a stitch (suture) placed in your cervix to prevent your cervix from opening too early (cerclage). Taking or being given medicines, such as: Hormone medicines. These may be given early in pregnancy to help support the pregnancy. Medicine to stop contractions. Medicines to help mature the baby's lungs. These may be prescribed if the risk of delivery is high. Medicines to prevent your baby from developing cerebral palsy. If the labor happens before 34 weeks of pregnancy, you may need to stay in the hospital. What should I do if I  think I am in preterm labor? If you think that you are going into preterm labor, call your health care provider right away. How can I prevent preterm labor in future pregnancies? To increase your chance of having a full-term pregnancy: Do not use any tobacco products, such as cigarettes, chewing tobacco, and e-cigarettes. If you need help quitting, ask your health care provider. Do not use street drugs or medicines that have not been prescribed to you during your pregnancy. Talk with your health care provider before taking any herbal supplements, even if you have been taking them regularly. Make sure you gain a healthy amount of weight during your pregnancy. Watch for infection. If you think that you might have an infection, get it checked right away. Make sure to tell your health care provider if you have gone into preterm labor before. This information is not intended to replace advice given to you by your health care provider. Make sure you discuss any questions you have with your health care provider. Document Revised: 09/29/2018 Document Reviewed: 10/29/2015 Elsevier Patient Education  2020 Elsevier Inc.   

## 2023-08-10 NOTE — Progress Notes (Signed)
HIGH-RISK PREGNANCY VISIT Patient name: Shelly Mckenzie MRN [id number]  Date of birth: Mar 08, 2001 Chief Complaint:   Routine Prenatal Visit  History of Present Illness:   Shelly Mckenzie is a 23 y.o. G71P0000 female at [redacted]w[redacted]d with an Estimated Date of Delivery: 10/03/23 being seen today for ongoing management of a high-risk pregnancy complicated by diabetes mellitus A1DM.    Today she reports  all fastings wnl, 2hr pp all normal but one of 125 . 'Charlie Horses' in calves, some pelvic pressure at night. Contractions: Not present. Vag. Bleeding: None.  Movement: Present. denies leaking of fluid.      07/06/2023    8:50 AM 03/22/2023   10:14 AM 07/05/2022    2:16 PM  Depression screen PHQ 2/9  Decreased Interest 0 0 0  Down, Depressed, Hopeless 0 0 0  PHQ - 2 Score 0 0 0  Altered sleeping 1 0 0  Tired, decreased energy 1 1 0  Change in appetite 0 0 0  Feeling bad or failure about yourself  0 0 0  Trouble concentrating 0 0 0  Moving slowly or fidgety/restless 0 0 0  Suicidal thoughts 0 0 0  PHQ-9 Score 2 1 0        03/22/2023   10:14 AM 07/05/2022    2:16 PM  GAD 7 : Generalized Anxiety Score  Nervous, Anxious, on Edge 0 0  Control/stop worrying 0 0  Worry too much - different things 0 0  Trouble relaxing 0 0  Restless 0 0  Easily annoyed or irritable 0 0  Afraid - awful might happen 0 0  Total GAD 7 Score 0 0     Review of Systems:   Pertinent items are noted in HPI Denies abnormal vaginal discharge w/ itching/odor/irritation, headaches, visual changes, shortness of breath, chest pain, abdominal pain, severe nausea/vomiting, or problems with urination or bowel movements unless otherwise stated above. Pertinent History Reviewed:  Reviewed past medical,surgical, social, obstetrical and family history.  Reviewed problem list, medications and allergies. Physical Assessment:   Vitals:   08/10/23 0916  BP: 117/72  Pulse: 70  Weight: 204 lb 6.4 oz (92.7 kg)  Body mass index  is 38.62 kg/m.           Physical Examination:   General appearance: alert, well appearing, and in no distress  Mental status: alert, oriented to person, place, and time  Skin: warm & dry   Extremities: Edema: None    Cardiovascular: normal heart rate noted  Respiratory: normal respiratory effort, no distress  Abdomen: gravid, soft, non-tender  Pelvic: Cervical exam deferred         Fetal Status:     Movement: Present    Fetal Surveillance Testing today: [place] 32+2 wks,cephalic,anterior placenta gr 1,AFI 15 cm,FHR 144 bpm,EFW 1882 g 31%   Chaperone: N/A    No results found for this or any previous visit (from the past 24 hours).  Assessment & Plan:  High-risk pregnancy: G1P0000 at [redacted]w[redacted]d with an Estimated Date of Delivery: 10/03/23   1) A1DM, stable, EFW 31% today  2) PG BMI 37  3) Leg cramps> can try magnesium  Meds: No orders of the defined types were placed in this encounter.   Labs/procedures today: U/S  Treatment Plan:  EFW q 4w   No antenatal testing as long as remains off meds     Deliver @ 39-39.6wks:____   Reviewed: Preterm labor symptoms and general obstetric precautions including but not limited to  vaginal bleeding, contractions, leaking of fluid and fetal movement were reviewed in detail with the patient.  All questions were answered. Does have home bp cuff. Office bp cuff given: not applicable. Check bp weekly, let us know if consistently >140 and/or >90.  Follow-up: Return in about 2 weeks (around 08/24/2023) for HROB, MD or CNM, in person; 4wks from now EFW u/s and HROB w/ MD/CNM.   Future Appointments  Date Time Provider Department Center  08/24/2023  9:50 AM Cheral Marker, CNM [facility]  09/08/2023  1:30 PM [facility]G 2 [facility] None  09/08/2023  2:30 PM Eure, Amaryllis Dyke, MD [facility]    No orders of the defined types were placed in this encounter.  Cheral Marker CNM, [facility] 08/10/2023 10:14 AM

## 2023-08-10 NOTE — Progress Notes (Signed)
[place] 32+2 wks,cephalic,anterior placenta gr 1,AFI 15 cm,FHR 144 bpm,EFW 1882 g 31%

## 2023-08-20 ENCOUNTER — Encounter (HOSPITAL_COMMUNITY): Payer: Self-pay | Admitting: Obstetrics & Gynecology

## 2023-08-20 ENCOUNTER — Inpatient Hospital Stay (HOSPITAL_COMMUNITY)
Admission: AD | Admit: 2023-08-20 | Discharge: 2023-08-21 | Disposition: A | Discharge status: Home / Self Care | Attending: Obstetrics & Gynecology | Admitting: Obstetrics & Gynecology

## 2023-08-20 DIAGNOSIS — B9789 Other viral agents as the cause of diseases classified elsewhere: Secondary | ICD-10-CM | POA: Diagnosis not present

## 2023-08-20 DIAGNOSIS — J4520 Mild intermittent asthma, uncomplicated: Secondary | ICD-10-CM | POA: Insufficient documentation

## 2023-08-20 DIAGNOSIS — O0993 Supervision of high risk pregnancy, unspecified, third trimester: Secondary | ICD-10-CM | POA: Diagnosis not present

## 2023-08-20 DIAGNOSIS — O26893 Other specified pregnancy related conditions, third trimester: Secondary | ICD-10-CM | POA: Diagnosis not present

## 2023-08-20 DIAGNOSIS — Z3A33 33 weeks gestation of pregnancy: Secondary | ICD-10-CM | POA: Diagnosis not present

## 2023-08-20 DIAGNOSIS — R071 Chest pain on breathing: Secondary | ICD-10-CM

## 2023-08-20 DIAGNOSIS — O99513 Diseases of the respiratory system complicating pregnancy, third trimester: Secondary | ICD-10-CM | POA: Diagnosis present

## 2023-08-20 DIAGNOSIS — R079 Chest pain, unspecified: Secondary | ICD-10-CM | POA: Diagnosis present

## 2023-08-20 DIAGNOSIS — R051 Acute cough: Secondary | ICD-10-CM | POA: Diagnosis not present

## 2023-08-20 DIAGNOSIS — R059 Cough, unspecified: Secondary | ICD-10-CM | POA: Diagnosis present

## 2023-08-20 DIAGNOSIS — O0990 Supervision of high risk pregnancy, unspecified, unspecified trimester: Secondary | ICD-10-CM

## 2023-08-20 DIAGNOSIS — J069 Acute upper respiratory infection, unspecified: Secondary | ICD-10-CM | POA: Insufficient documentation

## 2023-08-20 LAB — CBC
HCT: 32.4 % — ABNORMAL LOW (ref 36.0–46.0)
Hemoglobin: 10.6 g/dL — ABNORMAL LOW (ref 12.0–15.0)
MCH: 26.6 pg (ref 26.0–34.0)
MCHC: 32.7 g/dL (ref 30.0–36.0)
MCV: 81.2 fL (ref 80.0–100.0)
Platelets: 319 10*3/uL (ref 150–400)
RBC: 3.99 MIL/uL (ref 3.87–5.11)
RDW: 12.8 % (ref 11.5–15.5)
WBC: 9.2 10*3/uL (ref 4.0–10.5)
nRBC: 0 % (ref 0.0–0.2)

## 2023-08-20 LAB — TROPONIN I (HIGH SENSITIVITY): Troponin I (High Sensitivity): <2 ng/L (ref <18)

## 2023-08-20 LAB — GLUCOSE, CAPILLARY: Glucose-Capillary: 115 mg/dL — ABNORMAL HIGH (ref 70–99)

## 2023-08-20 MED ORDER — ALBUTEROL SULFATE (2.5 MG/3ML) 0.083% IN NEBU
2.5000 mg | INHALATION_SOLUTION | Freq: Once | RESPIRATORY_TRACT | Status: AC
  Administered 2023-08-20: 2.5 mg via RESPIRATORY_TRACT
  Filled 2023-08-20: qty 3

## 2023-08-20 NOTE — MAU Note (Addendum)
.  Shelly Mckenzie is a 23 y.o. at [redacted]w[redacted]d here in MAU reporting her chest hurting since last night but worse today. Has asthma and did a treatment but it did not help. No wheezing. Some SOB. Has a slight cough. Reports good FM and denies LOF or VB. Mild scratch throat. Chest only hurts when she takes in a deep breath. Pt states this feels like an asthma attack without the wheezing  LMP:n/a Onset of complaint: Friday night Pain score: 6 Vitals:   08/20/23 2108  Pulse: (!) 109  Resp: 18  Temp: 98 F (36.7 C)  SpO2: 99%     FHT: 145  Lab orders placed from triage: none

## 2023-08-20 NOTE — MAU Provider Note (Signed)
 Chief Complaint:  Chest Pain and Cough   Event Date/Time   First Provider Initiated Contact with Patient 08/20/23 2153      HPI: LYNDEL DANCEL is a 23 y.o. G1P0000 at [redacted]w[redacted]d who presents to maternity admissions reporting chest pain and cough with onset today. She has hx of mild intermittent asthma and reports this feels a lot like an asthma exacerbation but without the wheezing. She has a scratchy throat, but no other s/sx of respiratory infection.  She reports intermittent cramping, especially after standing on her feet at work, but no regular contractions.   She reports good fetal movement, denies LOF, vaginal bleeding, vaginal itching/burning, urinary symptoms, h/a, dizziness, n/v, or fever/chills.    HPI  Past Medical History: Past Medical History:  Diagnosis Date   ACL (anterior cruciate ligament) tear 10/21/2014   right   Asthma    Lateral meniscal tear 10/21/2014   right knee   Medial meniscus tear 10/21/2014   right    Past obstetric history: OB History  Gravida Para Term Preterm AB Living  1 0 0 0 0 0  SAB IAB Ectopic Multiple Live Births  0 0 0 0     # Outcome Date GA Lbr Len/2nd Weight Sex Type Anes PTL Lv  1 Current             Past Surgical History: Past Surgical History:  Procedure Laterality Date   FOREIGN BODY REMOVAL ESOPHAGEAL  10/26/2001   KNEE ARTHROSCOPY WITH ANTERIOR CRUCIATE LIGAMENT (ACL) REPAIR WITH HAMSTRING GRAFT Right 11/01/2014   Procedure: RIGHT KNEE ARTHROSCOPY ,  LATERAL MENISCECTOMY, ANTERIOR CRUCIATE LIGAMENT (ACL), AUTOGRAFT HAMSTRING;  Surgeon: Salvatore Marvel, MD;  Location: [facility];  Service: Orthopedics;  Laterality: Right;   KNEE ARTHROSCOPY WITH LATERAL MENISECTOMY Right 11/01/2014   Procedure: KNEE ARTHROSCOPY WITH LATERAL MENISECTOMY;  Surgeon: Salvatore Marvel, MD;  Location: [facility];  Service: Orthopedics;  Laterality: Right;    Family History: Family History  Problem Relation Age of Onset   Asthma  Sister     Social History: Social History   Tobacco Use   Smoking status: Never   Smokeless tobacco: Never  Vaping Use   Vaping status: Never Used  Substance Use Topics   Alcohol use: No   Drug use: No    Allergies: No Known Allergies  Meds:  Medications Prior to Admission  Medication Sig Dispense Refill Last Dose/Taking   albuterol (VENTOLIN HFA) 108 (90 Base) MCG/ACT inhaler Inhale 1-2 puffs into the lungs every 6 (six) hours as needed for wheezing or shortness of breath.   08/20/2023 Evening   aspirin EC 81 MG tablet Take 1 tablet (81 mg total) by mouth daily. Swallow whole. 90 tablet 3 08/20/2023 Morning   Prenatal Vit-Fe Fumarate-FA (PRENATAL VITAMIN PO) Take by mouth.   08/20/2023 Morning   Accu-Chek Softclix Lancets lancets Use as instructed to check blood sugar 4 times daily 100 each 12    Blood Glucose Monitoring Suppl (ACCU-CHEK GUIDE ME) w/Device KIT 1 each by Does not apply route 4 (four) times daily. 1 kit 0    Blood Pressure Monitor MISC For regular home bp monitoring during pregnancy 1 each 0    glucose blood test strip Use as instructed to check blood sugar four times daily 100 each 12     ROS:  Review of Systems   I have reviewed patient's Past Medical Hx, Surgical Hx, Family Hx, Social Hx, medications and allergies.   Physical Exam  Patient Vitals for the past 24 hrs:  Temp Pulse Resp SpO2 Height Weight  08/20/23 2108 98 F (36.7 C) (!) 109 18 99 % 5\' 1"  (1.549 m) 93 kg   Constitutional: Well-developed, well-nourished female in no acute distress.  Cardiovascular: normal rate Respiratory: normal effort GI: Abd soft, non-tender, gravid appropriate for gestational age.  MS: Extremities nontender, no edema, normal ROM Neurologic: Alert and oriented x 4.  GU: Neg CVAT.  PELVIC EXAM: Cervix pink, visually closed, without lesion, scant white creamy discharge, vaginal walls and external genitalia normal Bimanual exam: Cervix 0/long/high, firm, anterior, neg CMT,  uterus nontender, nonenlarged, adnexa without tenderness, enlargement, or mass     FHT:  Baseline 145 , moderate variability, accelerations present, no decelerations Contractions: rare, mild to palpation   Labs: Results for orders placed or performed during the hospital encounter of 08/20/23 (from the past 24 hours)  Glucose, capillary     Status: Abnormal   Collection Time: 08/20/23  9:41 PM  Result Value Ref Range   Glucose-Capillary 115 (H) 70 - 99 mg/dL   O/Positive/-- (16/10 1229)  Imaging:  No results found.  MAU Course/MDM: Orders Placed This Encounter  Procedures   Glucose, capillary    No orders of the defined types were placed in this encounter.    NST reviewed and reactive   Consult *** with presentation, exam findings and test results.  Treatments in MAU included ***.   Pt discharge with strict *** precautions.    Assessment: 1. Supervision of high risk pregnancy, antepartum     Plan: Discharge home Labor precautions and fetal kick counts  Allergies as of 08/20/2023   No Known Allergies   Med Rec must be completed prior to using this [facility]***       Sharen Counter Certified Nurse-Midwife 08/20/2023 9:54 PM

## 2023-08-21 ENCOUNTER — Encounter: Payer: Self-pay | Admitting: Women's Health

## 2023-08-21 DIAGNOSIS — R071 Chest pain on breathing: Secondary | ICD-10-CM | POA: Diagnosis not present

## 2023-08-21 DIAGNOSIS — J4520 Mild intermittent asthma, uncomplicated: Secondary | ICD-10-CM

## 2023-08-21 DIAGNOSIS — Z3A33 33 weeks gestation of pregnancy: Secondary | ICD-10-CM | POA: Diagnosis not present

## 2023-08-21 DIAGNOSIS — O26893 Other specified pregnancy related conditions, third trimester: Secondary | ICD-10-CM

## 2023-08-21 DIAGNOSIS — J069 Acute upper respiratory infection, unspecified: Secondary | ICD-10-CM | POA: Diagnosis not present

## 2023-08-21 DIAGNOSIS — R051 Acute cough: Secondary | ICD-10-CM | POA: Diagnosis not present

## 2023-08-21 LAB — RESP PANEL BY RT-PCR (RSV, FLU A&B, COVID)  RVPGX2
Influenza A by PCR: NEGATIVE
Influenza B by PCR: NEGATIVE
Resp Syncytial Virus by PCR: NEGATIVE
SARS Coronavirus 2 by RT PCR: NEGATIVE

## 2023-08-21 MED ORDER — BENZONATATE 100 MG PO CAPS: 100.0000 mg | ORAL_CAPSULE | Freq: Three times a day (TID) | ORAL | 0 refills | Status: DC

## 2023-08-21 NOTE — Progress Notes (Signed)
 Written and verbal d/c instructions given and understanding voiced.

## 2023-08-21 NOTE — Discharge Instructions (Signed)

## 2023-08-22 ENCOUNTER — Ambulatory Visit
Admission: EM | Admit: 2023-08-22 | Discharge: 2023-08-22 | Disposition: A | Discharge status: Home / Self Care | Attending: Family Medicine | Admitting: Family Medicine

## 2023-08-22 DIAGNOSIS — J069 Acute upper respiratory infection, unspecified: Secondary | ICD-10-CM

## 2023-08-22 DIAGNOSIS — J4521 Mild intermittent asthma with (acute) exacerbation: Secondary | ICD-10-CM

## 2023-08-22 MED ORDER — ALBUTEROL SULFATE HFA 108 (90 BASE) MCG/ACT IN AERS: 2.0000 | INHALATION_SPRAY | RESPIRATORY_TRACT | 0 refills | Status: AC | PRN

## 2023-08-22 NOTE — ED Triage Notes (Addendum)
 Pt reports nasal congestion, headache, sore throat,cough, asthma flare up due to sx's x 2 days. Pt was seen at ED yesterday and was tested for COVID FLU A/B and RSV, all negative.   Pt is currently pregnant.

## 2023-08-22 NOTE — Discharge Instructions (Signed)
Colds/Coughs/Allergies: Benadryl (alcohol free) 25 mg every 6 hours as needed Breath right strips Claritin Cepacol throat lozenges Chloraseptic throat spray Cold-Eeze- up to three times per day Cough drops, alcohol free Flonase (by prescription only) Guaifenesin Mucinex Robitussin DM (plain only, alcohol free) Saline nasal spray/drops Sudafed (pseudoephedrine) & Actifed ** use only after [redacted] weeks gestation and if you do not have high blood pressure Tylenol Vicks Vaporub Zinc lozenges Zyrtec

## 2023-08-22 NOTE — ED Provider Notes (Signed)
 [facility]    CSN: [id number] Arrival date & time: 08/22/23  0802      History   Chief Complaint No chief complaint on file.   HPI Shelly Mckenzie is a 23 y.o. female.   Patient presenting today with 2-day history of congestion, headache, sore throat, cough, wheezing at times.  Denies fever, chills, chest pain, shortness of breath, abdominal pain, vomiting, diarrhea.  Was seen at the [facility] emergency department 2 days ago for the same and was diagnosed with viral respiratory infection with asthma exacerbation.  She was given a list of pregnancy safe medications as she is in her third trimester of pregnancy and has an albuterol inhaler which she has been using every 4 hours.  She states she is not trying any other medications over-the-counter for symptoms at this time and is requesting a list of safe things to take.  COVID flu and RSV testing were negative in the emergency department.    Past Medical History:  Diagnosis Date   ACL (anterior cruciate ligament) tear 10/21/2014   right   Asthma    Lateral meniscal tear 10/21/2014   right knee   Medial meniscus tear 10/21/2014   right    Patient Active Problem List   Diagnosis Date Noted   UTI (urinary tract infection) during pregnancy, second trimester 07/11/2023   Gestational diabetes 07/07/2023   Alpha thalassemia silent carrier 04/04/2023   GBS bacteriuria 03/24/2023   Supervision of high risk pregnancy, antepartum 03/18/2023    Past Surgical History:  Procedure Laterality Date   FOREIGN BODY REMOVAL ESOPHAGEAL  10/26/2001   KNEE ARTHROSCOPY WITH ANTERIOR CRUCIATE LIGAMENT (ACL) REPAIR WITH HAMSTRING GRAFT Right 11/01/2014   Procedure: RIGHT KNEE ARTHROSCOPY ,  LATERAL MENISCECTOMY, ANTERIOR CRUCIATE LIGAMENT (ACL), AUTOGRAFT HAMSTRING;  Surgeon: Salvatore Marvel, MD;  Location: [facility];  Service: Orthopedics;  Laterality: Right;   KNEE ARTHROSCOPY WITH LATERAL MENISECTOMY Right 11/01/2014    Procedure: KNEE ARTHROSCOPY WITH LATERAL MENISECTOMY;  Surgeon: Salvatore Marvel, MD;  Location: [facility];  Service: Orthopedics;  Laterality: Right;    OB History     Gravida  1   Para  0   Term  0   Preterm  0   AB  0   Living  0      SAB  0   IAB  0   Ectopic  0   Multiple  0   Live Births               Home Medications    Prior to Admission medications   Medication Sig Start Date End Date Taking? Authorizing Provider  Accu-Chek Softclix Lancets lancets Use as instructed to check blood sugar 4 times daily 07/07/23   Cheral Marker, CNM  albuterol (VENTOLIN HFA) 108 (90 Base) MCG/ACT inhaler Inhale 2 puffs into the lungs every 4 (four) hours as needed for wheezing or shortness of breath. 08/22/23   Particia Nearing, PA-C  aspirin EC 81 MG tablet Take 1 tablet (81 mg total) by mouth daily. Swallow whole. 03/22/23   Cheral Marker, CNM  benzonatate (TESSALON) 100 MG capsule Take 1 capsule (100 mg total) by mouth every 8 (eight) hours. 08/21/23   Leftwich-Kirby, Wilmer Floor, CNM  Blood Glucose Monitoring Suppl (ACCU-CHEK GUIDE ME) w/Device KIT 1 each by Does not apply route 4 (four) times daily. 07/07/23   Cheral Marker, CNM  Blood Pressure Monitor MISC For regular home bp monitoring during  pregnancy 07/27/23   Cam Hai D, CNM  glucose blood test strip Use as instructed to check blood sugar four times daily 07/07/23   Cheral Marker, CNM  Prenatal Vit-Fe Fumarate-FA (PRENATAL VITAMIN PO) Take by mouth.    [provider]    Family History Family History  Problem Relation Age of Onset   Asthma Sister     Social History Social History   Tobacco Use   Smoking status: Never   Smokeless tobacco: Never  Vaping Use   Vaping status: Never Used  Substance Use Topics   Alcohol use: No   Drug use: No     Allergies   Patient has no known allergies.   Review of Systems Review of Systems Per HPI  Physical Exam Triage  Vital Signs ED Triage Vitals  Encounter Vitals Group     BP 08/22/23 0827 115/76     Systolic BP Percentile --      Diastolic BP Percentile --      Pulse Rate 08/22/23 0827 95     Resp 08/22/23 0827 16     Temp 08/22/23 0827 98.1 F (36.7 C)     Temp Source 08/22/23 0827 Oral     SpO2 08/22/23 0827 97 %     Weight --      Height --      Head Circumference --      Peak Flow --      Pain Score 08/22/23 0832 6     Pain Loc --      Pain Education --      Exclude from Growth Chart --    No data found.  Updated Vital Signs BP 115/76 (BP Location: Right Arm)   Pulse 95   Temp 98.1 F (36.7 C) (Oral)   Resp 16   LMP 12/27/2022 (Exact Date)   SpO2 97%   Visual Acuity Right Eye Distance:   Left Eye Distance:   Bilateral Distance:    Right Eye Near:   Left Eye Near:    Bilateral Near:     Physical Exam Vitals and nursing note reviewed.  Constitutional:      Appearance: Normal appearance.  HENT:     Head: Atraumatic.     Right Ear: Tympanic membrane and external ear normal.     Left Ear: Tympanic membrane and external ear normal.     Nose: Rhinorrhea present.     Mouth/Throat:     Mouth: Mucous membranes are moist.     Pharynx: Posterior oropharyngeal erythema present.  Eyes:     Extraocular Movements: Extraocular movements intact.     Conjunctiva/sclera: Conjunctivae normal.  Cardiovascular:     Rate and Rhythm: Normal rate and regular rhythm.     Heart sounds: Normal heart sounds.  Pulmonary:     Effort: Pulmonary effort is normal.     Breath sounds: Normal breath sounds. No wheezing or rales.  Musculoskeletal:        General: Normal range of motion.     Cervical back: Normal range of motion and neck supple.  Skin:    General: Skin is warm and dry.  Neurological:     Mental Status: She is alert and oriented to person, place, and time.  Psychiatric:        Mood and Affect: Mood normal.        Thought Content: Thought content normal.      UC Treatments  / Results  Labs (all labs ordered are  listed, but only abnormal results are displayed) Labs Reviewed - No data to display  EKG   Radiology No results found.  Procedures Procedures (including critical care time)  Medications Ordered in UC Medications - No data to display  Initial Impression / Assessment and Plan / UC Course  I have reviewed the triage vital signs and the nursing notes.  Pertinent labs & imaging results that were available during my care of the patient were reviewed by me and considered in my medical decision making (see chart for details).     Vital signs and exam very reassuring today, consistent with viral respiratory infection causing asthma exacerbation.  List given of pregnancy safe medications for her symptoms and refilled albuterol inhaler for use every 4 hours as needed.  Discussed supportive home care and return precautions.  Final Clinical Impressions(s) / UC Diagnoses   Final diagnoses:  Viral URI with cough  Mild intermittent asthma with acute exacerbation     Discharge Instructions      Colds/Coughs/Allergies: Benadryl (alcohol free) 25 mg every 6 hours as needed Breath right strips Claritin Cepacol throat lozenges Chloraseptic throat spray Cold-Eeze- up to three times per day Cough drops, alcohol free Flonase (by prescription only) Guaifenesin Mucinex Robitussin DM (plain only, alcohol free) Saline nasal spray/drops Sudafed (pseudoephedrine) & Actifed ** use only after [redacted] weeks gestation and if you do not have high blood pressure Tylenol Vicks Vaporub Zinc lozenges Zyrtec       ED Prescriptions     Medication Sig Dispense Auth. Provider   albuterol (VENTOLIN HFA) 108 (90 Base) MCG/ACT inhaler Inhale 2 puffs into the lungs every 4 (four) hours as needed for wheezing or shortness of breath. 18 g Particia Nearing, [place]      PDMP not reviewed this encounter.   Particia Nearing, [place] 08/22/23 1057

## 2023-08-24 ENCOUNTER — Ambulatory Visit (INDEPENDENT_AMBULATORY_CARE_PROVIDER_SITE_OTHER): Payer: Medicaid Other | Admitting: Women's Health

## 2023-08-24 ENCOUNTER — Encounter: Payer: Self-pay | Admitting: Women's Health

## 2023-08-24 VITALS — BP 130/85 | HR 101 | Wt 204.0 lb

## 2023-08-24 DIAGNOSIS — O24410 Gestational diabetes mellitus in pregnancy, diet controlled: Secondary | ICD-10-CM

## 2023-08-24 DIAGNOSIS — O0993 Supervision of high risk pregnancy, unspecified, third trimester: Secondary | ICD-10-CM | POA: Diagnosis not present

## 2023-08-24 DIAGNOSIS — Z3A34 34 weeks gestation of pregnancy: Secondary | ICD-10-CM

## 2023-08-24 NOTE — Patient Instructions (Signed)
Shelly Mckenzie, thank you for choosing our office today! We appreciate the opportunity to meet your healthcare needs. You may receive a short survey by mail, e-mail, or through MyChart. If you are happy with your care we would appreciate if you could take just a few minutes to complete the survey questions. We read all of your comments and take your feedback very seriously. Thank you again for choosing our office.  [facility] ([address] [place], [place] [address]) Entrance C, located off of [address] Free 24/7 valet parking   CLASSES: Go to [email or web address] to register for classes (childbirth, breastfeeding, waterbirth, infant CPR, daddy bootcamp, etc.)  Call the office ([phone]) or go to [facility] if: You begin to have strong, frequent contractions Your water breaks.  Sometimes it is a big gush of fluid, sometimes it is just a trickle that keeps getting your panties wet or running down your legs You have vaginal bleeding.  It is normal to have a small amount of spotting if your cervix was checked.  You don't feel your baby moving like normal.  If you don't, get you something to eat and drink and lay down and focus on feeling your baby move.   If your baby is still not moving like normal, you should call the office or go to [facility].  Call the office ([phone]) or go to [facility] for these signs of pre-eclampsia: Severe headache that does not go away with Tylenol Visual changes- seeing spots, double, blurred vision Pain under your right breast or upper abdomen that does not go away with Tums or heartburn medicine Nausea and/or vomiting Severe swelling in your hands, feet, and face   Tdap Vaccine It is recommended that you get the Tdap vaccine during the third trimester of EACH pregnancy to help protect your baby from getting pertussis (whooping cough) 27-36 weeks is the BEST time to do  this so that you can pass the protection on to your baby. During pregnancy is better than after pregnancy, but if you are unable to get it during pregnancy it will be offered at the hospital.  You can get this vaccine with us, at the health department, your family doctor, or some local pharmacies Everyone who will be around your baby should also be up-to-date on their vaccines before the baby comes. Adults (who are not pregnant) only need 1 dose of Tdap during adulthood.   [place] Pediatricians/Family Doctors [facility] ([facility]): [address], [phone]           [facility]: [address], [phone]                [facility] ([facility]): [address], [phone] (call to ask if accepting patients) [facility]: [address], [place], [phone]    [place] Pediatricians/Family Doctors [facility] ([facility]): [address], [phone] [facility]: [address], [phone] [facility]: [address], [phone]  [place] [facility]  [facility] ([facility]): [phone] [facility]: [address], [phone]   [place] Family Doctors [facility]: [address], [phone]  [place] [facility]  [facility]: [address], [phone]  Home Blood Pressure Monitoring for Patients   Your provider has recommended that you check your   blood pressure (BP) at least once a week at home. If you do not have a blood pressure cuff at home, one will be provided for you. Contact your provider if you have not received your monitor within 1 week.   Helpful Tips for Accurate Home Blood Pressure Checks  Don't smoke, exercise, or drink caffeine 30 minutes before checking your BP Use the restroom before checking your BP (a full bladder can raise your  pressure) Relax in a comfortable upright chair Feet on the ground Left arm resting comfortably on a flat surface at the level of your heart Legs uncrossed Back supported Sit quietly and don't talk Place the cuff on your bare arm Adjust snuggly, so that only two fingertips can fit between your skin and the top of the cuff Check 2 readings separated by at least one minute Keep a log of your BP readings For a visual, please reference this diagram: http://ccnc.care/bpdiagram  Provider Name: [facility]     Phone: [phone]  Zone 1: ALL CLEAR  Continue to monitor your symptoms:  BP reading is less than 140 (top number) or less than 90 (bottom number)  No right upper stomach pain No headaches or seeing spots No feeling nauseated or throwing up No swelling in face and hands  Zone 2: CAUTION Call your doctor's office for any of the following:  BP reading is greater than 140 (top number) or greater than 90 (bottom number)  Stomach pain under your ribs in the middle or right side Headaches or seeing spots Feeling nauseated or throwing up Swelling in face and hands  Zone 3: EMERGENCY  Seek immediate medical care if you have any of the following:  BP reading is greater than160 (top number) or greater than 110 (bottom number) Severe headaches not improving with Tylenol Serious difficulty catching your breath Any worsening symptoms from Zone 2  Preterm Labor and Birth Information  The normal length of a pregnancy is 39-41 weeks. Preterm labor is when labor starts before 37 completed weeks of pregnancy. What are the risk factors for preterm labor? Preterm labor is more likely to occur in women who: Have certain infections during pregnancy such as a bladder infection, sexually transmitted infection, or infection inside the uterus (chorioamnionitis). Have a shorter-than-normal cervix. Have gone into preterm labor before. Have had surgery on their cervix. Are younger than age 17  or older than age 35. Are African American. Are pregnant with twins or multiple babies (multiple gestation). Take street drugs or smoke while pregnant. Do not gain enough weight while pregnant. Became pregnant shortly after having been pregnant. What are the symptoms of preterm labor? Symptoms of preterm labor include: Cramps similar to those that can happen during a menstrual period. The cramps may happen with diarrhea. Pain in the abdomen or lower back. Regular uterine contractions that may feel like tightening of the abdomen. A feeling of increased pressure in the pelvis. Increased watery or bloody mucus discharge from the vagina. Water breaking (ruptured amniotic sac). Why is it important to recognize signs of preterm labor? It is important to recognize signs of preterm labor because babies who are born prematurely may not be fully developed. This can put them at an increased risk for: Long-term (chronic) heart and lung problems. Difficulty immediately after birth with regulating body systems, including blood sugar, body temperature, heart rate, and breathing rate. Bleeding in the brain. Cerebral palsy. Learning difficulties. Death. These risks are highest for babies who are born before 34 weeks   of pregnancy. How is preterm labor treated? Treatment depends on the length of your pregnancy, your condition, and the health of your baby. It may involve: Having a stitch (suture) placed in your cervix to prevent your cervix from opening too early (cerclage). Taking or being given medicines, such as: Hormone medicines. These may be given early in pregnancy to help support the pregnancy. Medicine to stop contractions. Medicines to help mature the baby's lungs. These may be prescribed if the risk of delivery is high. Medicines to prevent your baby from developing cerebral palsy. If the labor happens before 34 weeks of pregnancy, you may need to stay in the hospital. What should I do if I  think I am in preterm labor? If you think that you are going into preterm labor, call your health care provider right away. How can I prevent preterm labor in future pregnancies? To increase your chance of having a full-term pregnancy: Do not use any tobacco products, such as cigarettes, chewing tobacco, and e-cigarettes. If you need help quitting, ask your health care provider. Do not use street drugs or medicines that have not been prescribed to you during your pregnancy. Talk with your health care provider before taking any herbal supplements, even if you have been taking them regularly. Make sure you gain a healthy amount of weight during your pregnancy. Watch for infection. If you think that you might have an infection, get it checked right away. Make sure to tell your health care provider if you have gone into preterm labor before. This information is not intended to replace advice given to you by your health care provider. Make sure you discuss any questions you have with your health care provider. Document Revised: 09/29/2018 Document Reviewed: 10/29/2015 Elsevier Patient Education  2020 Elsevier Inc.   

## 2023-08-24 NOTE — Progress Notes (Signed)
 HIGH-RISK PREGNANCY VISIT Patient name: Shelly Mckenzie MRN [id number]  Date of birth: [birth year] Chief Complaint:   Routine Prenatal Visit  History of Present Illness:   Shelly Mckenzie is a 23 y.o. G42P0000 female at [redacted]w[redacted]d with an Estimated Date of Delivery: 10/03/23 being seen today for ongoing management of a high-risk pregnancy complicated by diabetes mellitus A1DM and PG BMI 37.    Today she reports  all sugars wnl . Contractions: Irregular. Vag. Bleeding: None.  Movement: Present. denies leaking of fluid.      07/06/2023    8:50 AM 03/22/2023   10:14 AM 07/05/2022    2:16 PM  Depression screen PHQ 2/9  Decreased Interest 0 0 0  Down, Depressed, Hopeless 0 0 0  PHQ - 2 Score 0 0 0  Altered sleeping 1 0 0  Tired, decreased energy 1 1 0  Change in appetite 0 0 0  Feeling bad or failure about yourself  0 0 0  Trouble concentrating 0 0 0  Moving slowly or fidgety/restless 0 0 0  Suicidal thoughts 0 0 0  PHQ-9 Score 2 1 0        03/22/2023   10:14 AM 07/05/2022    2:16 PM  GAD 7 : Generalized Anxiety Score  Nervous, Anxious, on Edge 0 0  Control/stop worrying 0 0  Worry too much - different things 0 0  Trouble relaxing 0 0  Restless 0 0  Easily annoyed or irritable 0 0  Afraid - awful might happen 0 0  Total GAD 7 Score 0 0     Review of Systems:   Pertinent items are noted in HPI Denies abnormal vaginal discharge w/ itching/odor/irritation, headaches, visual changes, shortness of breath, chest pain, abdominal pain, severe nausea/vomiting, or problems with urination or bowel movements unless otherwise stated above. Pertinent History Reviewed:  Reviewed past medical,surgical, social, obstetrical and family history.  Reviewed problem list, medications and allergies. Physical Assessment:   Vitals:   08/24/23 1020  BP: 130/85  Pulse: (!) 101  Weight: 204 lb (92.5 kg)  Body mass index is 38.55 kg/m.           Physical Examination:   General appearance: alert, well  appearing, and in no distress  Mental status: alert, oriented to person, place, and time  Skin: warm & dry   Extremities: Edema: None    Cardiovascular: normal heart rate noted  Respiratory: normal respiratory effort, no distress  Abdomen: gravid, soft, non-tender  Pelvic: Cervical exam deferred         Fetal Status: Fetal Heart Rate (bpm): 145 Fundal Height: 34 cm Movement: Present    Fetal Surveillance Testing today: doppler   Chaperone: N/A  No results found for this or any previous visit (from the past 24 hours).  Assessment & Plan:  High-risk pregnancy: G1P0000 at [redacted]w[redacted]d with an Estimated Date of Delivery: 10/03/23   1) A1DM, stable, last EFW 31% at 32.2  2) DBP borderline, check home bp's daily (plans to buy cuff), if >140/90 or pre-e s/s, let us know  Meds: No orders of the defined types were placed in this encounter.   Labs/procedures today: none  Treatment Plan:  EFW next visit    No antenatal testing as long as remains off meds     Deliver @ 39-39.6wks:____   Reviewed: Preterm labor symptoms and general obstetric precautions including but not limited to vaginal bleeding, contractions, leaking of fluid and fetal movement were reviewed in detail  with the patient.  All questions were answered.  Follow-up: Return for As scheduled.   Future Appointments  Date Time Provider Department Center  09/08/2023  1:30 PM [facility] 2 [facility] None  09/08/2023  2:30 PM Eure, Amaryllis Dyke, MD [facility]    Orders Placed This Encounter  Procedures   US OB Follow Up   Cheral Marker CNM, [facility] 08/24/2023 10:38 AM

## 2023-08-26 DIAGNOSIS — O0990 Supervision of high risk pregnancy, unspecified, unspecified trimester: Secondary | ICD-10-CM | POA: Diagnosis not present

## 2023-09-04 ENCOUNTER — Ambulatory Visit
Admission: EM | Admit: 2023-09-04 | Discharge: 2023-09-04 | Disposition: A | Discharge status: Home / Self Care | Attending: Nurse Practitioner | Admitting: Nurse Practitioner

## 2023-09-04 ENCOUNTER — Encounter: Payer: Self-pay | Admitting: Emergency Medicine

## 2023-09-04 DIAGNOSIS — H6592 Unspecified nonsuppurative otitis media, left ear: Secondary | ICD-10-CM

## 2023-09-04 MED ORDER — AMOXICILLIN 500 MG PO CAPS: 500.0000 mg | ORAL_CAPSULE | Freq: Two times a day (BID) | ORAL | 0 refills | Status: DC

## 2023-09-04 MED ORDER — FLUTICASONE PROPIONATE 50 MCG/ACT NA SUSP: 2.0000 | Freq: Every day | NASAL | 0 refills | Status: AC

## 2023-09-04 NOTE — ED Triage Notes (Signed)
 Left ear pain x 4 days.  Has been using peroxide with no relief.

## 2023-09-04 NOTE — Discharge Instructions (Addendum)
 Take medication as prescribed. May take Tylenol for pain, fever, or general discomfort. Warm compresses to the affected ear help with comfort. Do not stick anything inside the ear while symptoms persist. Avoid getting water inside of the ear while symptoms persist. Follow-up with your primary care physician if symptoms fail to improve. Follow-up as needed.

## 2023-09-04 NOTE — ED Provider Notes (Signed)
 [facility]    CSN: [id number] Arrival date & time: 09/04/23  1118      History   Chief Complaint No chief complaint on file.   HPI Shelly Mckenzie is a 23 y.o. female.   The history is provided by the patient.   Patient presents for complaints of left ear pain is been present for the past 4 days.  Patient denies fever, chills, headache, ear drainage, runny nose, cough, abdominal pain, nausea, vomiting, diarrhea, rash, dizziness, or lightheadedness.  Reports she has been using peroxide with no relief.  Denies history of recurrent ear infections.  Patient is approximately [redacted] weeks pregnant.  Past Medical History:  Diagnosis Date   ACL (anterior cruciate ligament) tear 10/21/2014   right   Asthma    Lateral meniscal tear 10/21/2014   right knee   Medial meniscus tear 10/21/2014   right    Patient Active Problem List   Diagnosis Date Noted   UTI (urinary tract infection) during pregnancy, second trimester 07/11/2023   Gestational diabetes 07/07/2023   Alpha thalassemia silent carrier 04/04/2023   GBS bacteriuria 03/24/2023   Supervision of high risk pregnancy, antepartum 03/18/2023    Past Surgical History:  Procedure Laterality Date   FOREIGN BODY REMOVAL ESOPHAGEAL  10/26/2001   KNEE ARTHROSCOPY WITH ANTERIOR CRUCIATE LIGAMENT (ACL) REPAIR WITH HAMSTRING GRAFT Right 11/01/2014   Procedure: RIGHT KNEE ARTHROSCOPY ,  LATERAL MENISCECTOMY, ANTERIOR CRUCIATE LIGAMENT (ACL), AUTOGRAFT HAMSTRING;  Surgeon: Salvatore Marvel, MD;  Location: [facility];  Service: Orthopedics;  Laterality: Right;   KNEE ARTHROSCOPY WITH LATERAL MENISECTOMY Right 11/01/2014   Procedure: KNEE ARTHROSCOPY WITH LATERAL MENISECTOMY;  Surgeon: Salvatore Marvel, MD;  Location: [facility];  Service: Orthopedics;  Laterality: Right;    OB History     Gravida  1   Para  0   Term  0   Preterm  0   AB  0   Living  0      SAB  0   IAB  0   Ectopic  0    Multiple  0   Live Births               Home Medications    Prior to Admission medications   Medication Sig Start Date End Date Taking? Authorizing Provider  Accu-Chek Softclix Lancets lancets Use as instructed to check blood sugar 4 times daily 07/07/23   Cheral Marker, CNM  albuterol (VENTOLIN HFA) 108 (90 Base) MCG/ACT inhaler Inhale 2 puffs into the lungs every 4 (four) hours as needed for wheezing or shortness of breath. 08/22/23   Particia Nearing, PA-C  aspirin EC 81 MG tablet Take 1 tablet (81 mg total) by mouth daily. Swallow whole. 03/22/23   Cheral Marker, CNM  Blood Glucose Monitoring Suppl (ACCU-CHEK GUIDE ME) w/Device KIT 1 each by Does not apply route 4 (four) times daily. 07/07/23   Cheral Marker, CNM  Blood Pressure Monitor MISC For regular home bp monitoring during pregnancy 07/27/23   Cam Hai D, CNM  glucose blood test strip Use as instructed to check blood sugar four times daily 07/07/23   Cheral Marker, CNM  Prenatal Vit-Fe Fumarate-FA (PRENATAL VITAMIN PO) Take by mouth.    [provider]    Family History Family History  Problem Relation Age of Onset   Asthma Sister     Social History Social History   Tobacco Use   Smoking status: Never  Smokeless tobacco: Never  Vaping Use   Vaping status: Never Used  Substance Use Topics   Alcohol use: No   Drug use: No     Allergies   Patient has no known allergies.   Review of Systems Review of Systems Per HPI  Physical Exam Triage Vital Signs ED Triage Vitals  Encounter Vitals Group     BP 09/04/23 1139 122/82     Systolic BP Percentile --      Diastolic BP Percentile --      Pulse Rate 09/04/23 1139 77     Resp 09/04/23 1139 18     Temp 09/04/23 1139 98.4 F (36.9 C)     Temp Source 09/04/23 1139 Oral     SpO2 09/04/23 1139 96 %     Weight --      Height --      Head Circumference --      Peak Flow --      Pain Score 09/04/23 1140 7     Pain Loc --       Pain Education --      Exclude from Growth Chart --    No data found.  Updated Vital Signs BP 122/82 (BP Location: Right Arm)   Pulse 77   Temp 98.4 F (36.9 C) (Oral)   Resp 18   LMP 12/27/2022 (Exact Date)   SpO2 96%   Visual Acuity Right Eye Distance:   Left Eye Distance:   Bilateral Distance:    Right Eye Near:   Left Eye Near:    Bilateral Near:     Physical Exam Vitals and nursing note reviewed.  Constitutional:      General: She is not in acute distress.    Appearance: Normal appearance.  HENT:     Head: Normocephalic.     Right Ear: Tympanic membrane, ear canal and external ear normal.     Left Ear: Ear canal and external ear normal. A middle ear effusion is present. Tympanic membrane is erythematous and bulging.     Nose: Nose normal.     Mouth/Throat:     Mouth: Mucous membranes are moist.  Eyes:     Extraocular Movements: Extraocular movements intact.     Conjunctiva/sclera: Conjunctivae normal.     Pupils: Pupils are equal, round, and reactive to light.  Cardiovascular:     Rate and Rhythm: Normal rate and regular rhythm.     Pulses: Normal pulses.     Heart sounds: Normal heart sounds.  Pulmonary:     Effort: Pulmonary effort is normal. No respiratory distress.     Breath sounds: Normal breath sounds. No stridor. No wheezing, rhonchi or rales.  Abdominal:     General: Bowel sounds are normal.     Palpations: Abdomen is soft.     Tenderness: There is no abdominal tenderness.  Musculoskeletal:     Cervical back: Normal range of motion.  Lymphadenopathy:     Cervical: No cervical adenopathy.  Skin:    General: Skin is warm and dry.  Neurological:     General: No focal deficit present.     Mental Status: She is alert and oriented to person, place, and time.  Psychiatric:        Mood and Affect: Mood normal.        Behavior: Behavior normal.      UC Treatments / Results  Labs (all labs ordered are listed, but only abnormal results are  displayed) Labs Reviewed -  No data to display  EKG   Radiology No results found.  Procedures Procedures (including critical care time)  Medications Ordered in UC Medications - No data to display  Initial Impression / Assessment and Plan / UC Course  I have reviewed the triage vital signs and the nursing notes.  Pertinent labs & imaging results that were available during my care of the patient were reviewed by me and considered in my medical decision making (see chart for details).  On exam, patient with bulging and erythema of the left tympanic membrane with effusion.  Will treat with amoxicillin 500 mg twice daily for the next 10 days.  Fluticasone 50 mcg nasal spray prescribed for effusion.  Patient is currently [redacted] weeks pregnant.  Supportive care recommendations were provided and discussed with the patient to include fluids, rest, warm compresses, and over-the-counter analgesics.  Patient advised to follow-up with PCP if symptoms fail to improve.  Patient was in agreement with this plan of care and verbalizes understanding.  All questions were answered.  Patient stable for discharge.  Final Clinical Impressions(s) / UC Diagnoses   Final diagnoses:  None   Discharge Instructions   None    ED Prescriptions   None    PDMP not reviewed this encounter.   Abran Cantor, NP 09/04/23 1240

## 2023-09-06 ENCOUNTER — Other Ambulatory Visit: Payer: Self-pay | Admitting: Women's Health

## 2023-09-06 DIAGNOSIS — O24410 Gestational diabetes mellitus in pregnancy, diet controlled: Secondary | ICD-10-CM

## 2023-09-06 DIAGNOSIS — O0993 Supervision of high risk pregnancy, unspecified, third trimester: Secondary | ICD-10-CM

## 2023-09-08 ENCOUNTER — Ambulatory Visit: Payer: Medicaid Other | Admitting: Obstetrics & Gynecology

## 2023-09-08 ENCOUNTER — Other Ambulatory Visit: Payer: Self-pay | Admitting: Women's Health

## 2023-09-08 ENCOUNTER — Encounter: Payer: Self-pay | Admitting: Obstetrics & Gynecology

## 2023-09-08 ENCOUNTER — Ambulatory Visit: Payer: Medicaid Other | Admitting: Radiology

## 2023-09-08 ENCOUNTER — Other Ambulatory Visit (HOSPITAL_COMMUNITY)
Admission: RE | Admit: 2023-09-08 | Discharge: 2023-09-08 | Disposition: A | Discharge status: Home / Self Care | Source: Ambulatory Visit | Attending: Obstetrics & Gynecology | Admitting: Obstetrics & Gynecology

## 2023-09-08 VITALS — BP 114/76 | HR 71 | Wt 208.0 lb

## 2023-09-08 DIAGNOSIS — O0993 Supervision of high risk pregnancy, unspecified, third trimester: Secondary | ICD-10-CM

## 2023-09-08 DIAGNOSIS — Z3A36 36 weeks gestation of pregnancy: Secondary | ICD-10-CM

## 2023-09-08 DIAGNOSIS — O24410 Gestational diabetes mellitus in pregnancy, diet controlled: Secondary | ICD-10-CM

## 2023-09-08 NOTE — Progress Notes (Signed)
 HIGH-RISK PREGNANCY VISIT Patient name: Shelly Mckenzie MRN [id number]  Date of birth: September 10, 2000 Chief Complaint:   Routine Prenatal Visit  History of Present Illness:   Shelly Mckenzie is a 23 y.o. G31P0000 female at [redacted]w[redacted]d with an Estimated Date of Delivery: 10/03/23 being seen today for ongoing management of a high-risk pregnancy complicated by A1DM.    Today she reports no complaints. Contractions: Not present. Vag. Bleeding: None.  Movement: Present. denies leaking of fluid.      07/06/2023    8:50 AM 03/22/2023   10:14 AM 07/05/2022    2:16 PM  Depression screen PHQ 2/9  Decreased Interest 0 0 0  Down, Depressed, Hopeless 0 0 0  PHQ - 2 Score 0 0 0  Altered sleeping 1 0 0  Tired, decreased energy 1 1 0  Change in appetite 0 0 0  Feeling bad or failure about yourself  0 0 0  Trouble concentrating 0 0 0  Moving slowly or fidgety/restless 0 0 0  Suicidal thoughts 0 0 0  PHQ-9 Score 2 1 0        03/22/2023   10:14 AM 07/05/2022    2:16 PM  GAD 7 : Generalized Anxiety Score  Nervous, Anxious, on Edge 0 0  Control/stop worrying 0 0  Worry too much - different things 0 0  Trouble relaxing 0 0  Restless 0 0  Easily annoyed or irritable 0 0  Afraid - awful might happen 0 0  Total GAD 7 Score 0 0     Review of Systems:   Pertinent items are noted in HPI Denies abnormal vaginal discharge w/ itching/odor/irritation, headaches, visual changes, shortness of breath, chest pain, abdominal pain, severe nausea/vomiting, or problems with urination or bowel movements unless otherwise stated above. Pertinent History Reviewed:  Reviewed past medical,surgical, social, obstetrical and family history.  Reviewed problem list, medications and allergies. Physical Assessment:   Vitals:   09/08/23 1411  BP: 114/76  Pulse: 71  Weight: 208 lb (94.3 kg)  Body mass index is 39.3 kg/m.           Physical Examination:   General appearance: alert, well appearing, and in no distress  Mental  status: alert, oriented to person, place, and time  Skin: warm & dry   Extremities:      Cardiovascular: normal heart rate noted  Respiratory: normal respiratory effort, no distress  Abdomen: gravid, soft, non-tender  Pelvic: Cervical exam performed       2.5/50/-1  Fetal Status:     Movement: Present    Fetal Surveillance Testing today: 8/10   Chaperone: Faith Rogue    No results found for this or any previous visit (from the past 24 hours).  Assessment & Plan:  High-risk pregnancy: G1P0000 at [redacted]w[redacted]d with an Estimated Date of Delivery: 10/03/23      ICD-10-CM   1. Encounter for supervision of high risk pregnancy in third trimester, antepartum  O09.93 Cervicovaginal ancillary only( [place])    2. Gestational diabetes mellitus, class A1: EFW 37%  O24.410     3. [redacted] weeks gestation of pregnancy  Z3A.36 Cervicovaginal ancillary only( [place])        Meds: No orders of the defined types were placed in this encounter.   Orders: No orders of the defined types were placed in this encounter.    Labs/procedures today: U/S  Treatment Plan:  IOL 40 w    Follow-up: No follow-ups on file.  No future appointments.  No orders of the defined types were placed in this encounter.  Lazaro Arms  Attending Physician for the Center for [facility] 09/08/2023 3:18 PM

## 2023-09-08 NOTE — Progress Notes (Signed)
 [place]:  GA = 36+3 weeks Single active female fetus, cepahlic, FHR = 139 bpm, difficult to measure head due to low position.  Anterior pl, gr2, EFW 37%, 2783g,  BPP = 6/8, respirations seen but not sustained x 30 seconds over duration of 20+ minutes scan time.   UAD performed and appears normal,   RI: 0.58, 0.63   58%

## 2023-09-12 ENCOUNTER — Encounter (HOSPITAL_COMMUNITY): Payer: Self-pay | Admitting: Family Medicine

## 2023-09-12 ENCOUNTER — Inpatient Hospital Stay (HOSPITAL_COMMUNITY)
Admission: AD | Admit: 2023-09-12 | Discharge: 2023-09-14 | DRG: 806 | Disposition: A | Discharge status: Home / Self Care | Attending: Obstetrics and Gynecology | Admitting: Obstetrics and Gynecology

## 2023-09-12 ENCOUNTER — Inpatient Hospital Stay (HOSPITAL_COMMUNITY): Admitting: Anesthesiology

## 2023-09-12 ENCOUNTER — Other Ambulatory Visit: Payer: Self-pay

## 2023-09-12 DIAGNOSIS — Z148 Genetic carrier of other disease: Secondary | ICD-10-CM | POA: Diagnosis not present

## 2023-09-12 DIAGNOSIS — D62 Acute posthemorrhagic anemia: Secondary | ICD-10-CM | POA: Diagnosis not present

## 2023-09-12 DIAGNOSIS — Z3A37 37 weeks gestation of pregnancy: Secondary | ICD-10-CM | POA: Diagnosis not present

## 2023-09-12 DIAGNOSIS — Z7982 Long term (current) use of aspirin: Secondary | ICD-10-CM

## 2023-09-12 DIAGNOSIS — O9902 Anemia complicating childbirth: Secondary | ICD-10-CM | POA: Diagnosis present

## 2023-09-12 DIAGNOSIS — O24420 Gestational diabetes mellitus in childbirth, diet controlled: Secondary | ICD-10-CM | POA: Diagnosis not present

## 2023-09-12 DIAGNOSIS — Z5986 Financial insecurity: Secondary | ICD-10-CM

## 2023-09-12 DIAGNOSIS — O326XX0 Maternal care for compound presentation, not applicable or unspecified: Secondary | ICD-10-CM | POA: Diagnosis not present

## 2023-09-12 DIAGNOSIS — O99820 Streptococcus B carrier state complicating pregnancy: Secondary | ICD-10-CM | POA: Diagnosis not present

## 2023-09-12 DIAGNOSIS — O4292 Full-term premature rupture of membranes, unspecified as to length of time between rupture and onset of labor: Principal | ICD-10-CM | POA: Diagnosis present

## 2023-09-12 DIAGNOSIS — O4202 Full-term premature rupture of membranes, onset of labor within 24 hours of rupture: Secondary | ICD-10-CM | POA: Diagnosis not present

## 2023-09-12 DIAGNOSIS — O4290 Premature rupture of membranes, unspecified as to length of time between rupture and onset of labor, unspecified weeks of gestation: Principal | ICD-10-CM | POA: Diagnosis present

## 2023-09-12 DIAGNOSIS — O99824 Streptococcus B carrier state complicating childbirth: Secondary | ICD-10-CM | POA: Diagnosis not present

## 2023-09-12 DIAGNOSIS — O26893 Other specified pregnancy related conditions, third trimester: Secondary | ICD-10-CM | POA: Diagnosis not present

## 2023-09-12 LAB — RPR: RPR Ser Ql: NONREACTIVE

## 2023-09-12 LAB — CBC
HCT: 33.4 % — ABNORMAL LOW (ref 36.0–46.0)
Hemoglobin: 10.8 g/dL — ABNORMAL LOW (ref 12.0–15.0)
MCH: 26.4 pg (ref 26.0–34.0)
MCHC: 32.3 g/dL (ref 30.0–36.0)
MCV: 81.7 fL (ref 80.0–100.0)
Platelets: 395 10*3/uL (ref 150–400)
RBC: 4.09 MIL/uL (ref 3.87–5.11)
RDW: 13.1 % (ref 11.5–15.5)
WBC: 8.9 10*3/uL (ref 4.0–10.5)
nRBC: 0 % (ref 0.0–0.2)

## 2023-09-12 LAB — CERVICOVAGINAL ANCILLARY ONLY
Chlamydia: NEGATIVE
Comment: NEGATIVE
Comment: NORMAL
Neisseria Gonorrhea: NEGATIVE

## 2023-09-12 LAB — GLUCOSE, CAPILLARY
Glucose-Capillary: 104 mg/dL — ABNORMAL HIGH (ref 70–99)
Glucose-Capillary: 92 mg/dL (ref 70–99)
Glucose-Capillary: 79 mg/dL (ref 70–99)

## 2023-09-12 LAB — TYPE AND SCREEN
ABO/RH(D): O POS
Antibody Screen: NEGATIVE

## 2023-09-12 LAB — POCT FERN TEST

## 2023-09-12 MED ORDER — COCONUT OIL OIL: 1.0000 | TOPICAL_OIL | Status: DC | PRN

## 2023-09-12 MED ORDER — DIPHENHYDRAMINE HCL 25 MG PO CAPS: 25.0000 mg | ORAL_CAPSULE | Freq: Four times a day (QID) | ORAL | Status: DC | PRN

## 2023-09-12 MED ORDER — LACTATED RINGERS IV SOLN: 500.0000 mL | Freq: Once | INTRAVENOUS | Status: DC

## 2023-09-12 MED ORDER — OXYCODONE HCL 5 MG PO TABS: 5.0000 mg | ORAL_TABLET | ORAL | Status: DC | PRN

## 2023-09-12 MED ORDER — TETANUS-DIPHTH-ACELL PERTUSSIS 5-2.5-18.5 LF-MCG/0.5 IM SUSY: 0.5000 mL | PREFILLED_SYRINGE | Freq: Once | INTRAMUSCULAR | Status: DC

## 2023-09-12 MED ORDER — OXYCODONE-ACETAMINOPHEN 5-325 MG PO TABS: 2.0000 | ORAL_TABLET | ORAL | Status: DC | PRN

## 2023-09-12 MED ORDER — ACETAMINOPHEN 325 MG PO TABS: 650.0000 mg | ORAL_TABLET | ORAL | Status: DC | PRN

## 2023-09-12 MED ORDER — OXYCODONE-ACETAMINOPHEN 5-325 MG PO TABS: 1.0000 | ORAL_TABLET | ORAL | Status: DC | PRN

## 2023-09-12 MED ORDER — FENTANYL-BUPIVACAINE-NACL 0.5-0.125-0.9 MG/250ML-% EP SOLN
12.0000 mL/h | EPIDURAL | Status: DC | PRN
  Administered 2023-09-12: 12 mL/h via EPIDURAL
  Filled 2023-09-12: qty 250

## 2023-09-12 MED ORDER — LIDOCAINE-EPINEPHRINE (PF) 1.5 %-1:200000 IJ SOLN
INTRAMUSCULAR | Status: DC | PRN
Start: 2023-09-12 — End: 2023-09-12
  Administered 2023-09-12: 3 mL via EPIDURAL

## 2023-09-12 MED ORDER — IBUPROFEN 600 MG PO TABS
600.0000 mg | ORAL_TABLET | Freq: Four times a day (QID) | ORAL | Status: DC
  Administered 2023-09-12 – 2023-09-14 (×7): 600 mg via ORAL
  Filled 2023-09-12 (×7): qty 1

## 2023-09-12 MED ORDER — OXYTOCIN BOLUS FROM INFUSION
333.0000 mL | Freq: Once | INTRAVENOUS | Status: AC
  Administered 2023-09-12: 333 mL via INTRAVENOUS

## 2023-09-12 MED ORDER — LACTATED RINGERS IV SOLN: 500.0000 mL | INTRAVENOUS | Status: DC | PRN

## 2023-09-12 MED ORDER — LIDOCAINE HCL (PF) 1 % IJ SOLN: 30.0000 mL | INTRAMUSCULAR | Status: DC | PRN

## 2023-09-12 MED ORDER — FENTANYL CITRATE (PF) 100 MCG/2ML IJ SOLN
50.0000 ug | INTRAMUSCULAR | Status: DC | PRN
Start: 2023-09-12 — End: 2023-09-12
  Administered 2023-09-12 (×2): 100 ug via INTRAVENOUS
  Filled 2023-09-12 (×2): qty 2

## 2023-09-12 MED ORDER — EPHEDRINE 5 MG/ML INJ: 10.0000 mg | INTRAVENOUS | Status: DC | PRN

## 2023-09-12 MED ORDER — WITCH HAZEL-GLYCERIN EX PADS: 1.0000 | MEDICATED_PAD | CUTANEOUS | Status: DC | PRN

## 2023-09-12 MED ORDER — OXYTOCIN-SODIUM CHLORIDE 30-0.9 UT/500ML-% IV SOLN
1.0000 m[IU]/min | INTRAVENOUS | Status: DC
  Administered 2023-09-12: 2 m[IU]/min via INTRAVENOUS

## 2023-09-12 MED ORDER — PHENYLEPHRINE 80 MCG/ML (10ML) SYRINGE FOR IV PUSH (FOR BLOOD PRESSURE SUPPORT)
80.0000 ug | PREFILLED_SYRINGE | INTRAVENOUS | Status: DC | PRN
  Filled 2023-09-12: qty 10

## 2023-09-12 MED ORDER — SOD CITRATE-CITRIC ACID 500-334 MG/5ML PO SOLN: 30.0000 mL | ORAL | Status: DC | PRN

## 2023-09-12 MED ORDER — DIPHENHYDRAMINE HCL 50 MG/ML IJ SOLN: 12.5000 mg | INTRAMUSCULAR | Status: DC | PRN

## 2023-09-12 MED ORDER — PRENATAL MULTIVITAMIN CH
1.0000 | ORAL_TABLET | Freq: Every day | ORAL | Status: DC
  Filled 2023-09-12: qty 1

## 2023-09-12 MED ORDER — SENNOSIDES-DOCUSATE SODIUM 8.6-50 MG PO TABS
2.0000 | ORAL_TABLET | Freq: Every day | ORAL | Status: DC
  Administered 2023-09-13: 2 via ORAL
  Filled 2023-09-12: qty 2

## 2023-09-12 MED ORDER — TERBUTALINE SULFATE 1 MG/ML IJ SOLN: 0.2500 mg | Freq: Once | INTRAMUSCULAR | Status: DC | PRN

## 2023-09-12 MED ORDER — ONDANSETRON HCL 4 MG PO TABS: 4.0000 mg | ORAL_TABLET | ORAL | Status: DC | PRN

## 2023-09-12 MED ORDER — OXYCODONE HCL 5 MG PO TABS: 10.0000 mg | ORAL_TABLET | ORAL | Status: DC | PRN

## 2023-09-12 MED ORDER — ONDANSETRON HCL 4 MG/2ML IJ SOLN: 4.0000 mg | Freq: Four times a day (QID) | INTRAMUSCULAR | Status: DC | PRN

## 2023-09-12 MED ORDER — SIMETHICONE 80 MG PO CHEW: 80.0000 mg | CHEWABLE_TABLET | ORAL | Status: DC | PRN

## 2023-09-12 MED ORDER — PENICILLIN G POT IN DEXTROSE 60000 UNIT/ML IV SOLN
3.0000 10*6.[IU] | INTRAVENOUS | Status: DC
  Administered 2023-09-12: 3 10*6.[IU] via INTRAVENOUS
  Filled 2023-09-12 (×2): qty 50

## 2023-09-12 MED ORDER — ONDANSETRON HCL 4 MG/2ML IJ SOLN: 4.0000 mg | INTRAMUSCULAR | Status: DC | PRN

## 2023-09-12 MED ORDER — LACTATED RINGERS IV SOLN: INTRAVENOUS | Status: DC

## 2023-09-12 MED ORDER — DIBUCAINE (PERIANAL) 1 % EX OINT
1.0000 | TOPICAL_OINTMENT | CUTANEOUS | Status: DC | PRN
Start: 2023-09-12 — End: 2023-09-14

## 2023-09-12 MED ORDER — OXYTOCIN-SODIUM CHLORIDE 30-0.9 UT/500ML-% IV SOLN
2.5000 [IU]/h | INTRAVENOUS | Status: DC
  Filled 2023-09-12: qty 500

## 2023-09-12 MED ORDER — PHENYLEPHRINE 80 MCG/ML (10ML) SYRINGE FOR IV PUSH (FOR BLOOD PRESSURE SUPPORT): 80.0000 ug | PREFILLED_SYRINGE | INTRAVENOUS | Status: DC | PRN

## 2023-09-12 MED ORDER — SODIUM CHLORIDE 0.9 % IV SOLN
5.0000 10*6.[IU] | Freq: Once | INTRAVENOUS | Status: AC
  Administered 2023-09-12: 5 10*6.[IU] via INTRAVENOUS
  Filled 2023-09-12: qty 5

## 2023-09-12 MED ORDER — BENZOCAINE-MENTHOL 20-0.5 % EX AERO
1.0000 | INHALATION_SPRAY | CUTANEOUS | Status: DC | PRN
  Administered 2023-09-13: 1 via TOPICAL
  Filled 2023-09-12: qty 56

## 2023-09-12 MED ORDER — ZOLPIDEM TARTRATE 5 MG PO TABS: 5.0000 mg | ORAL_TABLET | Freq: Every evening | ORAL | Status: DC | PRN

## 2023-09-12 NOTE — Progress Notes (Addendum)
 Patient ID: Shelly Mckenzie, female   DOB: 2001/04/17, 23 y.o.   MRN: [id number]   Patient doing well following Epidural placement. After several position changes Rn having difficulty tracing contractions d/t pt habitus.   CNM to patient bedside to discuss IUPC. Reviewed risks and benefits with patient. Patient desires to proceed with IUPC placement. FHT Cat 1 prior to placement. SVE 4.5/90/-1 S. [name] CNM. Fetal head well applied to cervix, IUPC placement successful on first attempt. Clear fluid return noted. Patient and fetus tolerated well and FHT remained Cat 1 following IUPC placement.   Continue to titrate up pit as needed for adequate MVUs  Charlene Cowdrey Danella Deis) [name], MSN, CNM  Center for [facility]  09/12/2023 11:55 AM

## 2023-09-12 NOTE — Progress Notes (Signed)
 Shelly Mckenzie is a 23 y.o. G1P0000 at [redacted]w[redacted]d by LMP admitted for augmentation of labor after SROM @ 0315. Patient is a A1GDM.   Subjective: Patient doing well. She reports feeling some contractions. Inductions exchanged.  Objective: BP 127/82   Pulse 75   Temp 97.8 F (36.6 C) (Axillary)   Resp 16   Ht 5\' 1"  (1.549 m)   Wt 94.3 kg   LMP 12/27/2022 (Exact Date)   SpO2 99%   BMI 39.30 kg/m  No intake/output data recorded. No intake/output data recorded.  FHT:  FHR: 135 bpm, variability: moderate,  accelerations:  Present,  decelerations:  Absent UC:   regular, every 2-5 minutes SVE:   Dilation: 2.5 Effacement (%): 70 Station: -2 Exam by:: [employer] RN  Labs: Lab Results  Component Value Date   WBC 8.9 09/12/2023   HGB 10.8 (L) 09/12/2023   HCT 33.4 (L) 09/12/2023   MCV 81.7 09/12/2023   PLT 395 09/12/2023   CBG (last 3)  Recent Labs    09/12/23 0647 09/12/23 1031  GLUCAP 104* 92    Assessment / Plan: Augmentation of labor, s/p Pit at 0630.   Labor: Progressing on Pitocin. Continue to titrate up as needed.  Fetal Wellbeing:  Category I Pain Control:  IV pain meds I/D:   GBS positive on Urine and getting PCN prophylaxis   Anticipated MOD:  NSVD  Claudette Head, CNM 09/12/2023, 8:42 AM

## 2023-09-12 NOTE — Progress Notes (Signed)
 History     [id number]  Arrival date and time: 09/12/23 4098    Chief Complaint  Patient presents with   Rupture of Membranes     HPI Shelly Mckenzie is a 23 y.o. at [redacted]w[redacted]d with PMHx notable for GDM, GBS bacteriuria, who presents for leaking fluid.   Reports she woke up in bed and noticed leaking fluid around 0300 Has continued to be a slow trickle since then No bleeding Fetal movement is normal No contractions  O/Positive/-- (10/01 1229)  OB History     Gravida  1   Para  0   Term  0   Preterm  0   AB  0   Living  0      SAB  0   IAB  0   Ectopic  0   Multiple  0   Live Births              Past Medical History:  Diagnosis Date   ACL (anterior cruciate ligament) tear 10/21/2014   right   Asthma    Lateral meniscal tear 10/21/2014   right knee   Medial meniscus tear 10/21/2014   right    Past Surgical History:  Procedure Laterality Date   FOREIGN BODY REMOVAL ESOPHAGEAL  10/26/2001   KNEE ARTHROSCOPY WITH ANTERIOR CRUCIATE LIGAMENT (ACL) REPAIR WITH HAMSTRING GRAFT Right 11/01/2014   Procedure: RIGHT KNEE ARTHROSCOPY ,  LATERAL MENISCECTOMY, ANTERIOR CRUCIATE LIGAMENT (ACL), AUTOGRAFT HAMSTRING;  Surgeon: Salvatore Marvel, MD;  Location: [facility];  Service: Orthopedics;  Laterality: Right;   KNEE ARTHROSCOPY WITH LATERAL MENISECTOMY Right 11/01/2014   Procedure: KNEE ARTHROSCOPY WITH LATERAL MENISECTOMY;  Surgeon: Salvatore Marvel, MD;  Location: [facility];  Service: Orthopedics;  Laterality: Right;    Family History  Problem Relation Age of Onset   Asthma Sister     Social History   Socioeconomic History   Marital status: Single    Spouse name: Not on file   Number of children: Not on file   Years of education: Not on file   Highest education level: Not on file  Occupational History   Not on file  Tobacco Use   Smoking status: Never   Smokeless tobacco: Never  Vaping Use   Vaping status: Never Used   Substance and Sexual Activity   Alcohol use: No   Drug use: No   Sexual activity: Yes    Birth control/protection: None  Other Topics Concern   Not on file  Social History Narrative   Not on file   Social Drivers of Health   Financial Resource Strain: Medium Risk (03/22/2023)   Overall Financial Resource Strain (CARDIA)    Difficulty of Paying Living Expenses: Somewhat hard  Food Insecurity: No Food Insecurity (09/12/2023)   Hunger Vital Sign    Worried About Running Out of Food in the Last Year: Never true    Ran Out of Food in the Last Year: Never true  Transportation Needs: No Transportation Needs (09/12/2023)   PRAPARE - Administrator, Civil Service (Medical): No    Lack of Transportation (Non-Medical): No  Physical Activity: Insufficiently Active (03/22/2023)   Exercise Vital Sign    Days of Exercise per Week: 4 days    Minutes of Exercise per Session: 30 min  Stress: No Stress Concern Present (03/22/2023)   [employer] of Occupational Health - Occupational Stress Questionnaire    Feeling of Stress : Not at all  Social  Connections: Socially Isolated (03/22/2023)   Social Connection and Isolation Panel [NHANES]    Frequency of Communication with Friends and Family: More than three times a week    Frequency of Social Gatherings with Friends and Family: Three times a week    Attends Religious Services: Never    Active Member of Clubs or Organizations: No    Attends Banker Meetings: Never    Marital Status: Never married  Intimate Partner Violence: Not At Risk (09/12/2023)   Humiliation, Afraid, Rape, and Kick questionnaire    Fear of Current or Ex-Partner: No    Emotionally Abused: No    Physically Abused: No    Sexually Abused: No    No Known Allergies  No current facility-administered medications on file prior to encounter.   Current Outpatient Medications on File Prior to Encounter  Medication Sig Dispense Refill   Accu-Chek Softclix  Lancets lancets Use as instructed to check blood sugar 4 times daily 100 each 12   albuterol (VENTOLIN HFA) 108 (90 Base) MCG/ACT inhaler Inhale 2 puffs into the lungs every 4 (four) hours as needed for wheezing or shortness of breath. 18 g 0   amoxicillin (AMOXIL) 500 MG capsule Take 1 capsule (500 mg total) by mouth 2 (two) times daily for 10 days. 20 capsule 0   aspirin EC 81 MG tablet Take 1 tablet (81 mg total) by mouth daily. Swallow whole. 90 tablet 3   Blood Glucose Monitoring Suppl (ACCU-CHEK GUIDE ME) w/Device KIT 1 each by Does not apply route 4 (four) times daily. 1 kit 0   Blood Pressure Monitor MISC For regular home bp monitoring during pregnancy 1 each 0   fluticasone (FLONASE) 50 MCG/ACT nasal spray Place 2 sprays into both nostrils daily. 16 g 0   glucose blood test strip Use as instructed to check blood sugar four times daily 100 each 12   Prenatal Vit-Fe Fumarate-FA (PRENATAL VITAMIN PO) Take by mouth.       ROS Pertinent positives and negative per HPI, all others reviewed and negative  Physical Exam   BP 130/82   Pulse 91   Temp 98.3 F (36.8 C) (Oral)   Resp 19   Ht 5\' 1"  (1.549 m)   Wt 94.3 kg   LMP 12/27/2022 (Exact Date)   BMI 39.30 kg/m   Patient Vitals for the past 24 hrs:  BP Temp Temp src Pulse Resp Height Weight  09/12/23 0455 130/82 98.3 F (36.8 C) Oral 91 19 5\' 1"  (1.549 m) 94.3 kg    Physical Exam Vitals reviewed.  Constitutional:      General: She is not in acute distress.    Appearance: She is well-developed. She is not diaphoretic.  Eyes:     General: No scleral icterus. Pulmonary:     Effort: Pulmonary effort is normal. No respiratory distress.  Abdominal:     General: There is no distension.     Palpations: Abdomen is soft.     Tenderness: There is no abdominal tenderness. There is no guarding or rebound.  Genitourinary:    Comments: SSE: +pool, +fern Skin:    General: Skin is warm and dry.  Neurological:     Mental Status: She  is alert.     Coordination: Coordination normal.      Cervical Exam     FHT Baseline: 150 bpm Variability: Good {> 6 bpm) Accelerations: Reactive Decelerations: Absent Uterine activity: None Cat: I  Labs No results found for this or  any previous visit (from the past 24 hours).  Imaging No results found.  MAU Course  Procedures  Lab Orders         Fern Test    No orders of the defined types were placed in this encounter.  Imaging Orders  No imaging studies ordered today    MDM Moderate (Level 3-4)  Assessment and Plan  # PROM #[redacted] weeks gestation of pregnancy Confirmed ruptured on speculum exam with +pool and +ferning on slide.  #FWB FHT Cat I NST: Reactive   Dispo: admit to L&D    Venora Maples, MD/MPH 09/12/23 5:37 AM

## 2023-09-12 NOTE — MAU Note (Addendum)
 Pt here c/o her water broke around 0315, denies bleeding, positive FM.

## 2023-09-12 NOTE — Anesthesia Preprocedure Evaluation (Signed)
 Anesthesia Evaluation  Patient identified by MRN, date of birth, ID band Patient awake    Reviewed: Allergy & Precautions, Patient's Chart, lab work & pertinent test results  Airway Mallampati: III  TM Distance: >3 FB Neck ROM: Full    Dental  (+) Dental Advisory Given, Teeth Intact   Pulmonary asthma    Pulmonary exam normal breath sounds clear to auscultation       Cardiovascular negative cardio ROS Normal cardiovascular exam Rhythm:Regular Rate:Normal     Neuro/Psych negative neurological ROS     GI/Hepatic negative GI ROS, Neg liver ROS,,,  Endo/Other  diabetes    Renal/GU negative Renal ROS     Musculoskeletal negative musculoskeletal ROS (+)    Abdominal  (+) + obese  Peds  Hematology negative hematology ROS (+)   Anesthesia Other Findings   Reproductive/Obstetrics (+) Pregnancy                             Anesthesia Physical Anesthesia Plan  ASA: 2  Anesthesia Plan: Epidural   Post-op Pain Management:    Induction:   PONV Risk Score and Plan:   Airway Management Planned:   Additional Equipment:   Intra-op Plan:   Post-operative Plan:   Informed Consent: I have reviewed the patients History and Physical, chart, labs and discussed the procedure including the risks, benefits and alternatives for the proposed anesthesia with the patient or authorized representative who has indicated his/her understanding and acceptance.       Plan Discussed with:   Anesthesia Plan Comments:        Anesthesia Quick Evaluation

## 2023-09-12 NOTE — Lactation Note (Signed)
 This note was copied from a baby's chart. Lactation Consultation Note  Patient Name: Shelly Mckenzie [id number] Date: 09/12/2023 Age:25 hours  Mom declines Lactation.   Maternal Data    Feeding Nipple Type: Slow - flow  LATCH Score                    Lactation Tools Discussed/Used    Interventions    Discharge    Consult Status Consult Status: Complete    [name] G 09/12/2023, 11:51 PM

## 2023-09-12 NOTE — H&P (Signed)
 OBSTETRIC ADMISSION HISTORY AND PHYSICAL  Shelly Mckenzie is a 23 y.o. female G1P0000 with IUP at [redacted]w[redacted]d (dated by L/8, Estimated Date of Delivery: 10/03/23) presenting for SROM at 0315.   She reports +FMs, no VB, no blurry vision, headaches or peripheral edema, and RUQ pain.    She plans on breast and formula feeding. She declines birth control.  She received her prenatal care at [facility]   Prenatal History/Complications:  - A1GDM  - Alpha thal silent carrier  Past Medical History: Past Medical History:  Diagnosis Date   ACL (anterior cruciate ligament) tear 10/21/2014   right   Asthma    Lateral meniscal tear 10/21/2014   right knee   Medial meniscus tear 10/21/2014   right    Past Surgical History: Past Surgical History:  Procedure Laterality Date   FOREIGN BODY REMOVAL ESOPHAGEAL  10/26/2001   KNEE ARTHROSCOPY WITH ANTERIOR CRUCIATE LIGAMENT (ACL) REPAIR WITH HAMSTRING GRAFT Right 11/01/2014   Procedure: RIGHT KNEE ARTHROSCOPY ,  LATERAL MENISCECTOMY, ANTERIOR CRUCIATE LIGAMENT (ACL), AUTOGRAFT HAMSTRING;  Surgeon: Salvatore Marvel, MD;  Location: [facility];  Service: Orthopedics;  Laterality: Right;   KNEE ARTHROSCOPY WITH LATERAL MENISECTOMY Right 11/01/2014   Procedure: KNEE ARTHROSCOPY WITH LATERAL MENISECTOMY;  Surgeon: Salvatore Marvel, MD;  Location:  SUR[facility]ERY C[facility];  Service: Orthopedics;  Laterality: Right;    Obstetrical History: OB History     Gravida  1   Para  0   Term  0   Preterm  0   AB  0   Living  0      SAB  0   IAB  0   Ectopic  0   Multiple  0   Live Births              Social History Social History   Socioeconomic History   Marital status: Single    Spouse name: Not on file   Number of children: Not on file   Years of education: Not on file   Highest education level: Not on file  Occupational History   Not on file  Tobacco Use   Smoking status: Never   Smokeless tobacco: Never  Vaping Use    Vaping status: Never Used  Substance and Sexual Activity   Alcohol use: No   Drug use: No   Sexual activity: Yes    Birth control/protection: None  Other Topics Concern   Not on file  Social History Narrative   Not on file   Social Drivers of Health   Financial Resource Strain: Medium Risk (03/22/2023)   Overall Financial Resource Strain (CARDIA)    Difficulty of Paying Living Expenses: Somewhat hard  Food Insecurity: No Food Insecurity (09/12/2023)   Hunger Vital Sign    Worried About Running Out of Food in the Last Year: Never true    Ran Out of Food in the Last Year: Never true  Transportation Needs: No Transportation Needs (09/12/2023)   PRAPARE - Administrator, Civil Service (Medical): No    Lack of Transportation (Non-Medical): No  Physical Activity: Insufficiently Active (03/22/2023)   Exercise Vital Sign    Days of Exercise per Week: 4 days    Minutes of Exercise per Session: 30 min  Stress: No Stress Concern Present (03/22/2023)   [employer] of Occupational Health - Occupational Stress Questionnaire    Feeling of Stress : Not at all  Social Connections: Socially Isolated (03/22/2023)   Social Connection and  Isolation Panel [NHANES]    Frequency of Communication with Friends and Family: More than three times a week    Frequency of Social Gatherings with Friends and Family: Three times a week    Attends Religious Services: Never    Active Member of Clubs or Organizations: No    Attends Banker Meetings: Never    Marital Status: Never married    Family History: Family History  Problem Relation Age of Onset   Asthma Sister     Allergies: No Known Allergies  Medications Prior to Admission  Medication Sig Dispense Refill Last Dose/Taking   Accu-Chek Softclix Lancets lancets Use as instructed to check blood sugar 4 times daily 100 each 12    albuterol (VENTOLIN HFA) 108 (90 Base) MCG/ACT inhaler Inhale 2 puffs into the lungs every 4  (four) hours as needed for wheezing or shortness of breath. 18 g 0    amoxicillin (AMOXIL) 500 MG capsule Take 1 capsule (500 mg total) by mouth 2 (two) times daily for 10 days. 20 capsule 0    aspirin EC 81 MG tablet Take 1 tablet (81 mg total) by mouth daily. Swallow whole. 90 tablet 3    Blood Glucose Monitoring Suppl (ACCU-CHEK GUIDE ME) w/Device KIT 1 each by Does not apply route 4 (four) times daily. 1 kit 0    Blood Pressure Monitor MISC For regular home bp monitoring during pregnancy 1 each 0    fluticasone (FLONASE) 50 MCG/ACT nasal spray Place 2 sprays into both nostrils daily. 16 g 0    glucose blood test strip Use as instructed to check blood sugar four times daily 100 each 12    Prenatal Vit-Fe Fumarate-FA (PRENATAL VITAMIN PO) Take by mouth.        Review of Systems  All systems reviewed and negative except as stated in HPI.  Blood pressure 130/82, pulse 91, temperature 98.3 F (36.8 C), temperature source Oral, resp. rate 19, height 5\' 1"  (1.549 m), weight 94.3 kg, last menstrual period 12/27/2022. General appearance: alert and cooperative Lungs: breathing comfortably on room air Heart: regular rate Abdomen: soft, non-tender; gravid Extremities: no edema of bilateral lower extremities Presentation: cephalic Fetal monitoring: 150/mod/+a/-d Uterine activity: UI Dilation: 2.5 Effacement (%): 60, 50 Station: -2 Presentation: Vertex Exam by:: Dr. Lucianne Muss    Prenatal labs: ABO, Rh: O/Positive/-- (10/01 1229) Antibody: Negative (01/15 0836) Rubella: 1.03 (10/01 1229) RPR: Non Reactive (01/15 0836)  HBsAg: Negative (10/01 1229)  HIV: Non Reactive (01/15 0836)  GBS:  Pos 2 hr Glucola abnl Genetic screening insufficient fetal DNA x2 Anatomy US wnl Last [place]: At [redacted]w[redacted]d - cephalic presentation, EFW 2783g (37 %tile), AC 51%tile  Prenatal Transfer Tool  Maternal Diabetes: Yes:  Diabetes Type:  Diet controlled Genetic Screening: Insufficient fetal DNA x 2 Maternal  Ultrasounds/Referrals: Normal Fetal Ultrasounds or other Referrals:  None Maternal Substance Abuse:  No Significant Maternal Medications:  None Significant Maternal Lab Results:  Group B Strep positive Number of Prenatal Visits:greater than 3 verified prenatal visits Other Comments:  None  Results for orders placed or performed during the hospital encounter of 09/12/23 (from the past 24 hours)  Fern Test   Collection Time: 09/12/23  5:53 AM  Result Value Ref Range   POCT Fern Test      Patient Active Problem List   Diagnosis Date Noted   UTI (urinary tract infection) during pregnancy, second trimester 07/11/2023   Gestational diabetes 07/07/2023   Alpha thalassemia silent carrier 04/04/2023  GBS bacteriuria 03/24/2023   Supervision of high risk pregnancy, antepartum 03/18/2023    Assessment/Plan:  Shelly Mckenzie is a 23 y.o. G1P0000 at [redacted]w[redacted]d here for SROM on 3/24 at 0315  #Labor: Not contracting, will initiate Pitocin 2x2 #Pain: Per pt request #FWB: Cat I #ID:  GBS positive, start PCN #MOF: Both #MOC: Phexxi + condoms #Circ:  N/A  #A1GDM: CBG q4h  fetus AGA -- EFW 2783g 37%tile, AC 51%tile  #Alpha thal silent carrier  #BMI 39  Sundra Aland, MD OB Fellow, Faculty Practice [facility], Center for [facility] 09/12/23 5:57 AM

## 2023-09-12 NOTE — Anesthesia Procedure Notes (Signed)
 Epidural Patient location during procedure: OB Start time: 09/12/2023 10:06 AM End time: 09/12/2023 10:28 AM  Staffing Anesthesiologist: Lewie Loron, MD Performed: anesthesiologist   Preanesthetic Checklist Completed: patient identified, IV checked, risks and benefits discussed, monitors and equipment checked, pre-op evaluation and timeout performed  Epidural Patient position: sitting Prep: DuraPrep and site prepped and draped Patient monitoring: heart rate, continuous pulse ox and blood pressure Approach: midline Location: L3-L4 Injection technique: LOR air and LOR saline  Needle:  Needle type: Tuohy  Needle gauge: 17 G Needle length: 9 cm Needle insertion depth: 7 cm Catheter type: closed end flexible Catheter size: 19 Gauge Catheter at skin depth: 13 cm Test dose: negative and 1.5% lidocaine with Epi 1:200 K  Assessment Sensory level: T8 Events: blood not aspirated, no cerebrospinal fluid, injection not painful, no injection resistance, no paresthesia and negative IV test  Additional Notes Reason for block:procedure for pain

## 2023-09-12 NOTE — Discharge Summary (Signed)
 Postpartum Discharge Summary  Date of Service updated***     Patient Name: Shelly Mckenzie DOB: 2001-02-16 MRN: [id number]  Date of admission: 09/12/2023 Delivery date:09/12/2023 Delivering provider: Carlynn Herald Date of discharge: 09/12/2023  Admitting diagnosis: Premature rupture of membranes [O42.90] Intrauterine pregnancy: [redacted]w[redacted]d     Secondary diagnosis:  Principal Problem:   Premature rupture of membranes  Additional problems:  Patient Active Problem List   Diagnosis Date Noted   Premature rupture of membranes 09/12/2023   UTI (urinary tract infection) during pregnancy, second trimester 07/11/2023   Gestational diabetes 07/07/2023   Alpha thalassemia silent carrier 04/04/2023   GBS bacteriuria 03/24/2023   Supervision of high risk pregnancy, antepartum 03/18/2023       Discharge diagnosis: {DX.:23714}                                              Post partum procedures:{Postpartum procedures:23558} Augmentation: AROM and Pitocin Complications: None  Hospital course: Onset of Labor With Vaginal Delivery      23 y.o. yo G1P0000 at [redacted]w[redacted]d was admitted in Active Labor on 09/12/2023. Labor course was complicated by N/A   Membrane Rupture Time/Date: 3:15 AM,09/12/2023  Delivery Method:Vaginal, Spontaneous Operative Delivery:N/A Episiotomy: None Lacerations:  1st degree;Perineal;Periurethral Patient had a postpartum course complicated by ***.  She is ambulating, tolerating a regular diet, passing flatus, and urinating well. Patient is discharged home in stable condition on 09/12/23.  Newborn Data: Birth date:09/12/2023 Birth time:5:09 PM Gender:Female Living status:Living Apgars:9 ,9  Weight:   Magnesium Sulfate received: No BMZ received: No Rhophylac:N/A MMR:N/A T-DaP:Given prenatally Flu: Declined  RSV Vaccine received: No Transfusion:{Transfusion received:30440034}  Immunizations received: Immunization History  Administered Date(s) Administered    Tdap 07/06/2023    Physical exam  Vitals:   09/12/23 1520 09/12/23 1531 09/12/23 1601 09/12/23 1631  BP:  (!) 105/58 110/61 108/66  Pulse:  99 90 89  Resp:      Temp:   99 F (37.2 C)   TempSrc:   Oral   SpO2: 98%     Weight:      Height:       General: {Exam; general:21111117} Lochia: {Desc; appropriate/inappropriate:30686::"appropriate"} Uterine Fundus: {Desc; firm/soft:30687} Incision: {Exam; incision:21111123} DVT Evaluation: {Exam; dvt:2111122} Labs: Lab Results  Component Value Date   WBC 8.9 09/12/2023   HGB 10.8 (L) 09/12/2023   HCT 33.4 (L) 09/12/2023   MCV 81.7 09/12/2023   PLT 395 09/12/2023      Latest Ref Rng & Units 01/10/2018    6:45 PM  CMP  Glucose 70 - 99 mg/dL 409   BUN 6 - 20 mg/dL 8   Creatinine 8.11 - 9.14 mg/dL 7.82   Sodium 956 - 213 mmol/L 139   Potassium 3.5 - 5.1 mmol/L 4.0   Chloride 98 - 111 mmol/L 104   CO2 22 - 32 mmol/L 27   Calcium 8.9 - 10.3 mg/dL 9.4   Total Protein 6.5 - 8.1 g/dL 8.0   Total Bilirubin 0.3 - 1.2 mg/dL 0.6   Alkaline Phos 38 - 126 U/L 82   AST 15 - 41 U/L 16   ALT 0 - 44 U/L 19    Edinburgh Score:     No data to display         No data recorded  After visit meds:  Allergies as of 09/12/2023  No Known Allergies   Med Rec must be completed prior to using this [facility]***        Discharge home in stable condition Infant Feeding: {Baby feeding:23562} Infant Disposition:{CHL IP OB HOME WITH UJWJXB:14782} Discharge instruction: per After Visit Summary and Postpartum booklet. Activity: Advance as tolerated. Pelvic rest for 6 weeks.  Diet: {OB NFAO:13086578} Future Appointments: Future Appointments  Date Time Provider Department Center  09/16/2023 11:30 AM Lazaro Arms, MD [facility]   Follow up Visit:   Please schedule this patient for a In person postpartum visit in 4 weeks with the following provider: Any provider. Additional Postpartum F/U:2 hour GTT  High risk pregnancy  complicated by: GDM Delivery mode:  Vaginal, Spontaneous Anticipated Birth Control:   None   Message sent on 09/11/24    09/12/2023 Claudette Head, CNM

## 2023-09-13 DIAGNOSIS — D62 Acute posthemorrhagic anemia: Secondary | ICD-10-CM | POA: Diagnosis not present

## 2023-09-13 LAB — CBC
HCT: 26.9 % — ABNORMAL LOW (ref 36.0–46.0)
Hemoglobin: 8.9 g/dL — ABNORMAL LOW (ref 12.0–15.0)
MCH: 26.9 pg (ref 26.0–34.0)
MCHC: 33.1 g/dL (ref 30.0–36.0)
MCV: 81.3 fL (ref 80.0–100.0)
Platelets: 332 10*3/uL (ref 150–400)
RBC: 3.31 MIL/uL — ABNORMAL LOW (ref 3.87–5.11)
RDW: 13.3 % (ref 11.5–15.5)
WBC: 12 10*3/uL — ABNORMAL HIGH (ref 4.0–10.5)
nRBC: 0 % (ref 0.0–0.2)

## 2023-09-13 MED ORDER — FERROUS SULFATE 325 (65 FE) MG PO TABS
325.0000 mg | ORAL_TABLET | Freq: Every day | ORAL | Status: DC
  Administered 2023-09-13 – 2023-09-14 (×2): 325 mg via ORAL
  Filled 2023-09-13 (×2): qty 1

## 2023-09-13 NOTE — Progress Notes (Signed)
 POSTPARTUM PROGRESS NOTE  Subjective: Shelly Mckenzie is a 23 y.o. G1P1001 s/p SVD at [redacted]w[redacted]d.  She reports she doing well. No acute events overnight. She denies any problems with ambulating, voiding or po intake. Denies nausea or vomiting. She has passed flatus. Pain is well controlled.  Lochia is appropriate.  Objective: Blood pressure 113/81, pulse 81, temperature 98.8 F (37.1 C), resp. rate 18, height 5\' 1"  (1.549 m), weight 94.3 kg, last menstrual period 12/27/2022, SpO2 99%, unknown if currently breastfeeding.  Physical Exam:  General: alert, cooperative and no distress Chest: no respiratory distress Abdomen: soft, non-tender  Uterine Fundus: firm and at level of umbilicus Extremities: No calf swelling or tenderness  no edema  Recent Labs    09/12/23 0604 09/13/23 0324  HGB 10.8* 8.9*  HCT 33.4* 26.9*    Assessment/Plan: Shelly Mckenzie is a 22 y.o. G1P1001 s/p SVD at [redacted]w[redacted]d for SROM.  Routine Postpartum Care: Doing well, pain well-controlled.  -- Continue routine care, lactation support  -- Contraception: condoms -- Feeding: both breast and formula  Dispo: Plan for discharge tomorrow.  Denton Ar, MD Faculty Practice, Center for [facility] 09/13/2023 6:17 AM

## 2023-09-13 NOTE — Anesthesia Postprocedure Evaluation (Signed)
 Anesthesia Post Note  Patient: Shelly Mckenzie  Procedure(s) Performed: AN AD HOC LABOR EPIDURAL     Patient location during evaluation: Mother Baby Anesthesia Type: Epidural Level of consciousness: awake and alert and oriented Pain management: satisfactory to patient Vital Signs Assessment: post-procedure vital signs reviewed and stable Respiratory status: respiratory function stable Cardiovascular status: stable Postop Assessment: no headache, no backache, epidural receding, patient able to bend at knees, no signs of nausea or vomiting, adequate PO intake and able to ambulate Anesthetic complications: no   No notable events documented.  Last Vitals:  Vitals:   09/13/23 0023 09/13/23 0542  BP: 116/76 113/81  Pulse: 71 81  Resp: 18 18  Temp: 37.2 C 37.1 C  SpO2: 98% 99%    Last Pain:  Vitals:   09/13/23 0714  TempSrc:   PainSc: 0-No pain   Pain Goal:                Epidural/Spinal Function Cutaneous sensation: Normal sensation (09/13/23 0749)  Karleen Dolphin

## 2023-09-14 MED ORDER — SENNOSIDES-DOCUSATE SODIUM 8.6-50 MG PO TABS: 2.0000 | ORAL_TABLET | Freq: Every evening | ORAL | 0 refills | Status: AC | PRN

## 2023-09-14 MED ORDER — FERROUS SULFATE 325 (65 FE) MG PO TABS: 325.0000 mg | ORAL_TABLET | ORAL | 3 refills | Status: AC

## 2023-09-14 MED ORDER — IBUPROFEN 600 MG PO TABS: 600.0000 mg | ORAL_TABLET | Freq: Four times a day (QID) | ORAL | 0 refills | Status: AC | PRN

## 2023-09-14 MED ORDER — ACETAMINOPHEN 325 MG PO TABS: 650.0000 mg | ORAL_TABLET | ORAL | 0 refills | Status: AC | PRN

## 2023-09-16 ENCOUNTER — Encounter: Admitting: Obstetrics & Gynecology

## 2023-09-19 ENCOUNTER — Encounter (HOSPITAL_COMMUNITY): Payer: Self-pay | Admitting: Obstetrics & Gynecology

## 2023-09-19 ENCOUNTER — Inpatient Hospital Stay (HOSPITAL_COMMUNITY)
Admission: AD | Admit: 2023-09-19 | Discharge: 2023-09-20 | Disposition: A | Discharge status: Home / Self Care | Attending: Obstetrics & Gynecology | Admitting: Obstetrics & Gynecology

## 2023-09-19 DIAGNOSIS — D62 Acute posthemorrhagic anemia: Secondary | ICD-10-CM

## 2023-09-19 DIAGNOSIS — R030 Elevated blood-pressure reading, without diagnosis of hypertension: Secondary | ICD-10-CM | POA: Insufficient documentation

## 2023-09-19 DIAGNOSIS — O0990 Supervision of high risk pregnancy, unspecified, unspecified trimester: Secondary | ICD-10-CM

## 2023-09-19 DIAGNOSIS — O165 Unspecified maternal hypertension, complicating the puerperium: Secondary | ICD-10-CM | POA: Diagnosis not present

## 2023-09-19 LAB — COMPREHENSIVE METABOLIC PANEL WITH GFR
ALT: 29 U/L (ref 0–44)
AST: 16 U/L (ref 15–41)
Albumin: 2.8 g/dL — ABNORMAL LOW (ref 3.5–5.0)
Alkaline Phosphatase: 97 U/L (ref 38–126)
Anion gap: 10 (ref 5–15)
BUN: 10 mg/dL (ref 6–20)
CO2: 22 mmol/L (ref 22–32)
Calcium: 9.1 mg/dL (ref 8.9–10.3)
Chloride: 108 mmol/L (ref 98–111)
Creatinine, Ser: 0.61 mg/dL (ref 0.44–1.00)
GFR, Estimated: >60 mL/min (ref >60)
Glucose, Bld: 93 mg/dL (ref 70–99)
Potassium: 3.5 mmol/L (ref 3.5–5.1)
Sodium: 140 mmol/L (ref 135–145)
Total Bilirubin: 0.4 mg/dL (ref 0.0–1.2)
Total Protein: 6.1 g/dL — ABNORMAL LOW (ref 6.5–8.1)

## 2023-09-19 LAB — CBC
HCT: 28.8 % — ABNORMAL LOW (ref 36.0–46.0)
Hemoglobin: 9.5 g/dL — ABNORMAL LOW (ref 12.0–15.0)
MCH: 26.6 pg (ref 26.0–34.0)
MCHC: 33 g/dL (ref 30.0–36.0)
MCV: 80.7 fL (ref 80.0–100.0)
Platelets: 420 10*3/uL — ABNORMAL HIGH (ref 150–400)
RBC: 3.57 MIL/uL — ABNORMAL LOW (ref 3.87–5.11)
RDW: 12.9 % (ref 11.5–15.5)
WBC: 9.8 10*3/uL (ref 4.0–10.5)
nRBC: 0 % (ref 0.0–0.2)

## 2023-09-19 MED ORDER — NIFEDIPINE ER OSMOTIC RELEASE 30 MG PO TB24: 30.0000 mg | ORAL_TABLET | Freq: Every day | ORAL | 1 refills | Status: DC

## 2023-09-19 MED ORDER — NIFEDIPINE ER OSMOTIC RELEASE 30 MG PO TB24
30.0000 mg | ORAL_TABLET | Freq: Once | ORAL | Status: AC
  Administered 2023-09-19: 30 mg via ORAL
  Filled 2023-09-19: qty 1

## 2023-09-19 MED ORDER — NIFEDIPINE ER OSMOTIC RELEASE 30 MG PO TB24: 30.0000 mg | ORAL_TABLET | Freq: Every day | ORAL | Status: DC

## 2023-09-19 NOTE — MAU Provider Note (Signed)
 SRBP PP     S [name] is a 22 y.o. G11P1001 female who delivered via SVD 3/24 that was uncomplicated who presents to MAU today with complaint of elevated BP.  She states she randomly checked her BP today at 2130 and found it to be 167/117, denies HA now.  States had HA 3/30 but resolved with Tylenol. Denies vision changes, RUQ pain, or worsening swelling.   Receives care at [facility]. Prenatal records reviewed.  Pertinent items noted in HPI and remainder of comprehensive ROS otherwise negative.   O BP 125/89   Pulse 73   Temp 99.5 F (37.5 C) (Oral)   Resp 14   Ht 5\' 1"  (1.549 m)   Wt 89.5 kg   LMP 12/27/2022 (Exact Date)   BMI 37.28 kg/m  Physical Exam Vitals and nursing note reviewed.  Constitutional:      General: She is not in acute distress.    Appearance: Normal appearance. She is obese. She is not ill-appearing.  HENT:     Head: Normocephalic.     Right Ear: External ear normal.     Left Ear: External ear normal.     Nose: Nose normal. No congestion.     Mouth/Throat:     Mouth: Mucous membranes are moist.     Pharynx: Oropharynx is clear.  Eyes:     Extraocular Movements: Extraocular movements intact.     Conjunctiva/sclera: Conjunctivae normal.  Cardiovascular:     Rate and Rhythm: Normal rate.  Pulmonary:     Effort: Pulmonary effort is normal. No respiratory distress.     Breath sounds: No wheezing.  Abdominal:     General: Abdomen is flat. There is no distension.     Palpations: Abdomen is soft.     Tenderness: There is no abdominal tenderness.     Comments: Fundus firm and below umbilicus   Musculoskeletal:        General: No swelling. Normal range of motion.     Cervical back: Normal range of motion.  Skin:    General: Skin is warm and dry.  Neurological:     Mental Status: She is alert and oriented to person, place, and time. Mental status is at baseline.     Motor: No weakness.     Gait: Gait normal.  Psychiatric:        Mood and Affect:  Mood normal.        Behavior: Behavior normal.      MDM: moderate  MAU Course:  With 3 mild to moderate Bps here since admit.  Will give Procardia and assess after while awaiting labs.   CBC Hgb 9.5, Plts 420 CMP reassuring  AP #PP elevated BP  - started on procardia 30mg  XL every day given out of day 5 from delivery so less likely to benefit from lasix  Discharge from MAU in stable condition with strict/usual precautions Follow up at [facility] as scheduled for ongoing prenatal care  Allergies as of 09/19/2023   No Known Allergies      Medication List     TAKE these medications    acetaminophen 325 MG tablet Commonly known as: Tylenol Take 2 tablets (650 mg total) by mouth every 4 (four) hours as needed (for pain scale < 4).   albuterol 108 (90 Base) MCG/ACT inhaler Commonly known as: VENTOLIN HFA Inhale 2 puffs into the lungs every 4 (four) hours as needed for wheezing or shortness of breath.   ferrous sulfate 325 (  65 FE) MG tablet Take 1 tablet (325 mg total) by mouth every other day.   fluticasone 50 MCG/ACT nasal spray Commonly known as: FLONASE Place 2 sprays into both nostrils daily.   ibuprofen 600 MG tablet Commonly known as: ADVIL Take 1 tablet (600 mg total) by mouth every 6 (six) hours as needed.   NIFEdipine 30 MG 24 hr tablet Commonly known as: Procardia XL Take 1 tablet (30 mg total) by mouth daily.   PRENATAL VITAMIN PO Take by mouth.   senna-docusate 8.6-50 MG tablet Commonly known as: Senokot-S Take 2 tablets by mouth at bedtime as needed for mild constipation.        Hessie Dibble, MD 09/19/2023 11:59 PM

## 2023-09-19 NOTE — MAU Note (Signed)
 Pt says she del vag on Mon 09-12-2023- by Devona Konig home on Wed 09-14-2023. While preg , in labor, PP- no BP's problems. Today at 930pm- she randomly checked BP - 167/117. No H/A  now( Had H/A yesterday- took Tyl - went away. )  No vision problems , no epigastric pain.

## 2023-09-24 ENCOUNTER — Telehealth (HOSPITAL_COMMUNITY): Payer: Self-pay

## 2023-09-24 NOTE — Telephone Encounter (Signed)
 09/24/2023 1234  Name: Shelly Mckenzie MRN: [id number] DOB: Jun 23, 2000  Reason for Call:  Transition of Care Hospital Discharge Call  Contact Status: Patient Contact Status: Complete  Language assistant needed:          Follow-Up Questions: Do You Have Any Concerns About Your Health As You Heal From Delivery?: No Do You Have Any Concerns About Your Infants Health?: No  Edinburgh Postnatal Depression Scale:  In the Past 7 Days: I have been able to laugh and see the funny side of things.: As much as I always could I have looked forward with enjoyment to things.: As much as I ever did I have blamed myself unnecessarily when things went wrong.: No, never I have been anxious or worried for no good reason.: No, not at all I have felt scared or panicky for no good reason.: No, not at all Things have been getting on top of me.: No, I have been coping as well as ever I have been so unhappy that I have had difficulty sleeping.: Not at all I have felt sad or miserable.: No, not at all I have been so unhappy that I have been crying.: No, never The thought of harming myself has occurred to me.: Never Inocente Salles Postnatal Depression Scale Total: 0  PHQ2-9 Depression Scale:     Discharge Follow-up: Edinburgh score requires follow up?: No Patient was advised of the following resources:: Breastfeeding Support Group, Support Group  Post-discharge interventions: Reviewed Newborn Safe Sleep Practices  Signature  Signe Colt

## 2023-10-18 ENCOUNTER — Other Ambulatory Visit

## 2023-10-18 ENCOUNTER — Ambulatory Visit: Admitting: Women's Health

## 2023-10-18 ENCOUNTER — Encounter: Payer: Self-pay | Admitting: Women's Health

## 2023-10-18 DIAGNOSIS — Z1332 Encounter for screening for maternal depression: Secondary | ICD-10-CM

## 2023-10-18 DIAGNOSIS — Z3046 Encounter for surveillance of implantable subdermal contraceptive: Secondary | ICD-10-CM | POA: Diagnosis not present

## 2023-10-18 DIAGNOSIS — O165 Unspecified maternal hypertension, complicating the puerperium: Secondary | ICD-10-CM | POA: Diagnosis not present

## 2023-10-18 DIAGNOSIS — Z131 Encounter for screening for diabetes mellitus: Secondary | ICD-10-CM

## 2023-10-18 DIAGNOSIS — Z30017 Encounter for initial prescription of implantable subdermal contraceptive: Secondary | ICD-10-CM

## 2023-10-18 DIAGNOSIS — Z8632 Personal history of gestational diabetes: Secondary | ICD-10-CM

## 2023-10-18 DIAGNOSIS — Z3202 Encounter for pregnancy test, result negative: Secondary | ICD-10-CM

## 2023-10-18 LAB — POCT URINE PREGNANCY: Preg Test, Ur: NEGATIVE

## 2023-10-18 MED ORDER — ETONOGESTREL 68 MG ~~LOC~~ IMPL
68.0000 mg | DRUG_IMPLANT | Freq: Once | SUBCUTANEOUS | Status: AC
  Administered 2023-10-18: 68 mg via SUBCUTANEOUS

## 2023-10-18 NOTE — Patient Instructions (Signed)
 Keep the area clean and dry.  You can remove the big bandage in 24 hours, and the small steri-strip bandage in 3-5 days.  A back up method, such as condoms, should be used for two weeks. You may have irregular vaginal bleeding for the first 6 months after the Nexplanon is placed, then the bleeding usually lightens and it is possible that you may not have any periods.  If you have any concerns, please give [place] a call.    Etonogestrel Implant What is this medication? ETONOGESTREL (et oh noe JES trel) prevents ovulation and pregnancy. It belongs to a group of medications called contraceptives. This medication is a progestin hormone. This medicine may be used for other purposes; ask your health care provider or pharmacist if you have questions. COMMON BRAND NAME(S): Implanon, Nexplanon What should I tell my care team before I take this medication? They need to know if you have any of these conditions: Abnormal vaginal bleeding Blood clots Blood vessel disease Breast, cervical, endometrial, ovarian, liver, or uterine cancer Diabetes Gallbladder disease Heart disease or recent heart attack High blood pressure High cholesterol or triglycerides Kidney disease Liver disease Migraine headaches Seizures Stroke Tobacco use An unusual or allergic reaction to etonogestrel, other medications, foods, dyes, or preservatives Pregnant or trying to get pregnant Breastfeeding How should I use this medication? This device is inserted just under the skin on the inner side of your upper arm by your care team. Talk to your care team about the use of this medication in children. Special care may be needed. Overdosage: If you think you have taken too much of this medicine contact a poison control center or emergency room at once. NOTE: This medicine is only for you. Do not share this medicine with others. What if I miss a dose? This does not apply. What may interact with this medication? Do not take this  medication with any of the following: Amprenavir Fosamprenavir This medication may also interact with the following: Acitretin Aprepitant Armodafinil Bexarotene Bosentan Carbamazepine Certain antivirals for HIV or hepatitis Certain medications for fungal infections, such as fluconazole, ketoconazole, itraconazole, or voriconazole Cyclosporine Felbamate Griseofulvin Lamotrigine Modafinil Oxcarbazepine Phenobarbital Phenytoin Primidone Rifabutin Rifampin Rifapentine St. John's wort Topiramate This list may not describe all possible interactions. Give your health care provider a list of all the medicines, herbs, non-prescription drugs, or dietary supplements you use. Also tell them if you smoke, drink alcohol, or use illegal drugs. Some items may interact with your medicine. What should I watch for while using this medication? Visit your care team for regular checks on your progress. Using this medication does not protect you or your partner against HIV or other sexually transmitted infections (STIs). You should be able to feel the implant by pressing your fingertips over the skin where it was inserted. Contact your care team if you cannot feel the implant, and use a non-hormonal birth control method (such as condoms) until your care team confirms that the implant is in place. Contact your care team if you think that the implant may have broken or become bent while in your arm. You will receive a user card from your care team after the implant is inserted. The card is a record of the location of the implant in your upper arm and when it should be removed. Keep this card with your health records. What side effects may I notice from receiving this medication? Side effects that you should report to your care team as soon as  possible: Allergic reactions--skin rash, itching, hives, swelling of the face, lips, tongue, or throat Blood clot--pain, swelling, or warmth in the leg, shortness of  breath, chest pain Gallbladder problems--severe stomach pain, nausea, vomiting, fever Increase in blood pressure Liver injury--right upper belly pain, loss of appetite, nausea, light-colored stool, dark yellow or brown urine, yellowing skin or eyes, unusual weakness or fatigue New or worsening migraines or headaches Pain, redness, or irritation at injection site Stroke--sudden numbness or weakness of the face, arm, or leg, trouble speaking, confusion, trouble walking, loss of balance or coordination, dizziness, severe headache, change in vision Unusual vaginal discharge, itching, or odor Worsening mood, feelings of depression Side effects that usually do not require medical attention (report to your care team if they continue or are bothersome): Breast pain or tenderness Dark patches of skin on the face or other sun-exposed areas Irregular menstrual cycles or spotting Nausea Weight gain This list may not describe all possible side effects. Call your doctor for medical advice about side effects. You may report side effects to FDA at 1-800-FDA-1088. Where should I keep my medication? This medication is given in a hospital or clinic and will not be stored at home. NOTE: This sheet is a summary. It may not cover all possible information. If you have questions about this medicine, talk to your doctor, pharmacist, or health care provider.  2024 Elsevier/Gold Standard (2022-01-12 00:00:00)

## 2023-10-18 NOTE — Addendum Note (Signed)
 Addended by: Rayquan Amrhein E on: 10/18/2023 10:34 AM   Modules accepted: Orders

## 2023-10-18 NOTE — Addendum Note (Signed)
 Addended by: Abri Vacca E on: 10/18/2023 11:05 AM   Modules accepted: Orders

## 2023-10-18 NOTE — Progress Notes (Signed)
 POSTPARTUM VISIT Patient name: Shelly Mckenzie MRN [id number]  Date of birth: 2001/06/17 Chief Complaint:   Postpartum Care  History of Present Illness:   Shelly Mckenzie is a 23 y.o. G33P1001 female being seen today for a postpartum visit. She is 5 weeks postpartum following a spontaneous vaginal delivery at 37.0 gestational weeks. IOL: no, for n/a. Anesthesia: epidural.  Laceration: 1st degree and periurethral.  Complications: none. Inpatient contraception: no.   Pregnancy complicated by A1DM . Tobacco use: no. Substance use disorder: no. Last pap smear: 07/05/22 and results were NILM w/ HRHPV not done. Next pap smear due: 2027 Patient's last menstrual period was 12/27/2022 (exact date).  Postpartum course has been complicated by ppHTN 1wk after birth, went to MAU, rx'd nifedipine  30mg  daily, took this am . Bleeding none. Bowel function is normal. Bladder function is normal. Urinary incontinence? no, fecal incontinence? no Patient is not sexually active. Last sexual activity: prior to birth of baby. Desired contraception: Nexplanon. Patient does want a pregnancy in the future.  Desired family size is uncertain #of children.   Upstream - 10/18/23 0916       Pregnancy Intention Screening   Does the patient want to become pregnant in the next year? No    Does the patient's partner want to become pregnant in the next year? No    Would the patient like to discuss contraceptive options today? Yes      Contraception Wrap Up   Current Method Abstinence    End Method Hormonal Implant    Contraception Counseling Provided Yes            The pregnancy intention screening data noted above was reviewed. Potential methods of contraception were discussed. The patient elected to proceed with Hormonal Implant.  Edinburgh Postpartum Depression Screening: negative  Edinburgh Postnatal Depression Scale - 10/18/23 0921       Edinburgh Postnatal Depression Scale:  In the Past 7 Days   I have been  able to laugh and see the funny side of things. 0    I have looked forward with enjoyment to things. 0    I have blamed myself unnecessarily when things went wrong. 0    I have been anxious or worried for no good reason. 0    I have felt scared or panicky for no good reason. 0    Things have been getting on top of me. 0    I have been so unhappy that I have had difficulty sleeping. 0    I have felt sad or miserable. 0    I have been so unhappy that I have been crying. 0    The thought of harming myself has occurred to me. 0    Edinburgh Postnatal Depression Scale Total 0                03/22/2023   10:14 AM 07/05/2022    2:16 PM  GAD 7 : Generalized Anxiety Score  Nervous, Anxious, on Edge 0 0  Control/stop worrying 0 0  Worry too much - different things 0 0  Trouble relaxing 0 0  Restless 0 0  Easily annoyed or irritable 0 0  Afraid - awful might happen 0 0  Total GAD 7 Score 0 0     Baby's course has been uncomplicated. Baby is feeding by bottle. Infant has a pediatrician/family doctor? Yes.  Childcare strategy if returning to work/school: family.  Pt has material needs met for her and baby:  Yes.   Review of Systems:   Pertinent items are noted in HPI Denies Abnormal vaginal discharge w/ itching/odor/irritation, headaches, visual changes, shortness of breath, chest pain, abdominal pain, severe nausea/vomiting, or problems with urination or bowel movements. Pertinent History Reviewed:  Reviewed past medical,surgical, obstetrical and family history.  Reviewed problem list, medications and allergies. OB History  Gravida Para Term Preterm AB Living  1 1 1  0 0 1  SAB IAB Ectopic Multiple Live Births  0 0 0 0 1    # Outcome Date GA Lbr Len/2nd Weight Sex Type Anes PTL Lv  1 Term 09/12/23 [redacted]w[redacted]d  6 lb 1.7 oz (2.77 kg) F Vag-Spont EPI  LIV   Physical Assessment:   Vitals:   10/18/23 0907  BP: 119/82  Pulse: 77  Weight: 191 lb 6.4 oz (86.8 kg)  Height: 5\' 1"  (1.549 m)   Body mass index is 36.16 kg/m.       Physical Examination:   General appearance: alert, well appearing, and in no distress  Mental status: alert, oriented to person, place, and time  Skin: warm & dry   Cardiovascular: normal heart rate noted   Respiratory: normal respiratory effort, no distress   Breasts: deferred, no complaints   Abdomen: soft, non-tender   Pelvic: examination not indicated. Thin prep pap obtained: No  Rectal: not examined  Extremities: Edema: none   Chaperone: N/A       No results found for this or any previous visit (from the past 24 hours).   NEXPLANON INSERTION  Risks/benefits/side effects of Nexplanon have been discussed and her questions have been answered.  Specifically, a failure rate of 06/998 has been reported, with an increased failure rate if pt takes St. John's Wort and/or antiseizure medicaitons.  She is aware of the common side effect of irregular bleeding, which the incidence of decreases over time. Signed copy of informed consent in chart.   Time out was performed.  She is right-handed, so her left arm, approximately 10cm from the medial epicondyle and 3-5cm posterior to the sulcus, was cleansed with alcohol and anesthetized with 2cc of 2% Lidocaine .  The area was cleansed again with betadine and the Nexplanon was inserted per manufacturer's recommendations without difficulty.  3 steri-strips and pressure bandage were applied. The patient tolerated the procedure well.   Assessment & Plan:  1) Postpartum exam 2) 5 wks s/p spontaneous vaginal delivery 3) bottle feeding 4) Depression screening 5) Nexplanon insertion Pt was instructed to keep the area clean and dry, remove pressure bandage in 24 hours, and keep insertion site covered with the steri-strip for 3-5 days.  Condoms for 2 weeks.  She was given a card indicating date Nexplanon was inserted and date it needs to be removed. Follow-up PRN problems. 6) PPHTN> stop nifedipine  30mg , f/u 1wk for  bp check 7) A1DM during pregnancy> GTT today  Essential components of care per ACOG recommendations:  1.  Mood and well being:  If positive depression screen, discussed and plan developed.  If using tobacco we discussed reduction/cessation and risk of relapse If current substance abuse, we discussed and referral to local resources was offered.   2. Infant care and feeding:  If breastfeeding, discussed returning to work, pumping, breastfeeding-associated pain, guidance regarding return to fertility while lactating if not using another method. If needed, patient was provided with a letter to be allowed to pump q 2-3hrs to support lactation in a private location with access to a refrigerator to store breastmilk.  Recommended that all caregivers be immunized for flu, pertussis and other preventable communicable diseases If pt does not have material needs met for her/baby, referred to local resources for help obtaining these.  3. Sexuality, contraception and birth spacing Provided guidance regarding sexuality, management of dyspareunia, and resumption of intercourse Discussed avoiding interpregnancy interval <64mths and recommended birth spacing of 18 months  4. Sleep and fatigue Discussed coping options for fatigue and sleep disruption Encouraged family/partner/community support of 4 hrs of uninterrupted sleep to help with mood and fatigue  5. Physical recovery  If pt had a C/S, assessed incisional pain and providing guidance on normal vs prolonged recovery If pt had a laceration, perineal healing and pain reviewed.  If urinary or fecal incontinence, discussed management and referred to PT or uro/gyn if indicated  Patient is safe to resume physical activity. Discussed attainment of healthy weight.  6.  Chronic disease management Discussed pregnancy complications if any, and their implications for future childbearing and long-term maternal health. Review recommendations for prevention of  recurrent pregnancy complications, such as 17 hydroxyprogesterone caproate to reduce risk for recurrent PTB not applicable, or aspirin  to reduce risk of preeclampsia yes. Pt had GDM: Yes. If yes, 2hr GTT scheduled: yes. Reviewed medications and non-pregnant dosing including consideration of whether pt is breastfeeding using a reliable resource such as LactMed: not applicable Referred for f/u w/ PCP or subspecialist providers as indicated: not applicable (no PCP)  7. Health maintenance Mammogram at 25yo or earlier if indicated Pap smears as indicated  Meds: No orders of the defined types were placed in this encounter.   Follow-up: Return in about 1 week (around 10/25/2023) for nurse bp check; then 1 yr for physical.   No orders of the defined types were placed in this encounter.   Ferd Householder CNM, [facility] 10/18/2023 9:50 AM

## 2023-10-19 ENCOUNTER — Encounter: Payer: Self-pay | Admitting: Women's Health

## 2023-10-19 ENCOUNTER — Other Ambulatory Visit: Payer: Self-pay | Admitting: Women's Health

## 2023-10-19 DIAGNOSIS — R7302 Impaired glucose tolerance (oral): Secondary | ICD-10-CM

## 2023-10-19 DIAGNOSIS — Z8632 Personal history of gestational diabetes: Secondary | ICD-10-CM

## 2023-10-19 LAB — GLUCOSE TOLERANCE, 2 HOURS W/ 1HR
Glucose, 1 hour: 92 mg/dL (ref 70–179)
Glucose, 2 hour: 93 mg/dL (ref 70–152)
Glucose, Fasting: 93 mg/dL — ABNORMAL HIGH (ref 70–91)

## 2023-10-25 ENCOUNTER — Ambulatory Visit: Admitting: *Deleted

## 2023-10-25 VITALS — BP 118/78 | HR 71

## 2023-10-25 DIAGNOSIS — O165 Unspecified maternal hypertension, complicating the puerperium: Secondary | ICD-10-CM

## 2023-10-25 DIAGNOSIS — Z0130 Encounter for examination of blood pressure without abnormal findings: Secondary | ICD-10-CM

## 2023-10-25 NOTE — Progress Notes (Signed)
   NURSE VISIT- BLOOD PRESSURE CHECK  SUBJECTIVE:  Shelly Mckenzie is a 23 y.o. G18P1001 female here for BP check. She is postpartum, delivery date 09/12/23     HYPERTENSION ROS:  Postpartum:  Severe headaches that don't go away with tylenol /other medicines: No  Visual changes (seeing spots/double/blurred vision) No  Severe pain under right breast breast or in center of upper chest No  Severe nausea/vomiting No  Taking medicines as instructed not applicable    OBJECTIVE:  BP 118/78 (BP Location: Right Arm, Patient Position: Sitting, Cuff Size: Normal)   Pulse 71   Breastfeeding No   Appearance alert, well appearing, and in no distress.  ASSESSMENT: Postpartum  blood pressure check  PLAN: Discussed with Benny Braver, CNM, [facility]   Recommendations: no changes needed   Follow-up: as needed  Laverne Potter  10/25/2023 10:05 AM

## 2024-03-15 ENCOUNTER — Ambulatory Visit (INDEPENDENT_AMBULATORY_CARE_PROVIDER_SITE_OTHER)

## 2024-03-15 ENCOUNTER — Ambulatory Visit
Admission: EM | Admit: 2024-03-15 | Discharge: 2024-03-15 | Disposition: A | Discharge status: Home / Self Care | Attending: Family Medicine | Admitting: Family Medicine

## 2024-03-15 DIAGNOSIS — S93401A Sprain of unspecified ligament of right ankle, initial encounter: Secondary | ICD-10-CM

## 2024-03-15 NOTE — ED Triage Notes (Signed)
 Pt reports fall earlier today that caused ankle pain. Swelling is present pt is unable to bear weight ion the foot.

## 2024-03-15 NOTE — ED Provider Notes (Signed)
 [facility]    CSN: [id number] Arrival date & time: 03/15/24  1715      History   Chief Complaint No chief complaint on file.   HPI Shelly Mckenzie is a 23 y.o. female.   Patient presenting today with right lateral ankle pain and swelling after rolling the foot inward today during a fall.  Denies numbness, tingling, complete loss of range of motion, skin injury.  So far trying over-the-counter pain relievers with minimal relief.    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    Past Surgical History:  Procedure Laterality Date   FOREIGN BODY REMOVAL ESOPHAGEAL  10/26/2001   KNEE ARTHROSCOPY WITH ANTERIOR CRUCIATE LIGAMENT (ACL) REPAIR WITH HAMSTRING GRAFT Right 11/01/2014   Procedure: RIGHT KNEE ARTHROSCOPY ,  LATERAL MENISCECTOMY, ANTERIOR CRUCIATE LIGAMENT (ACL), AUTOGRAFT HAMSTRING;  Surgeon: Lamar Millman, MD;  Location: [facility];  Service: Orthopedics;  Laterality: Right;   KNEE ARTHROSCOPY WITH LATERAL MENISECTOMY Right 11/01/2014   Procedure: KNEE ARTHROSCOPY WITH LATERAL MENISECTOMY;  Surgeon: Lamar Millman, MD;  Location: [facility];  Service: Orthopedics;  Laterality: Right;   WISDOM TOOTH EXTRACTION Bilateral 2015    OB History     Gravida  1   Para  1   Term  1   Preterm  0   AB  0   Living  1      SAB  0   IAB  0   Ectopic  0   Multiple  0   Live Births  1            Home Medications    Prior to Admission medications   Medication Sig Start Date End Date Taking? Authorizing Provider  acetaminophen  (TYLENOL ) 325 MG  tablet Take 2 tablets (650 mg total) by mouth every 4 (four) hours as needed (for pain scale < 4). Patient not taking: Reported on 10/25/2023 09/14/23   Erik Kieth BROCKS, MD  albuterol  (VENTOLIN  HFA) 108 [phone] Base) MCG/ACT inhaler Inhale 2 puffs into the lungs every 4 (four) hours as needed for wheezing or shortness of breath. 08/22/23   Stuart Vernell Norris, PA-C  ferrous sulfate  325 (65 FE) MG tablet Take 1 tablet (325 mg total) by mouth every other day. 09/14/23   Erik Kieth BROCKS, MD  fluticasone  (FLONASE ) 50 MCG/ACT nasal spray Place 2 sprays into both nostrils daily. Patient not taking: Reported on 10/18/2023 09/04/23   Leath-Warren, Etta PARAS, NP  ibuprofen  (ADVIL ) 600 MG tablet Take 1 tablet (600 mg total) by mouth every 6 (six) hours as needed. Patient not taking: Reported on 10/25/2023 09/14/23   Erik Kieth BROCKS, MD  Prenatal Vit-Fe Fumarate-FA (PRENATAL VITAMIN PO) Take by mouth. Patient not taking: Reported on 10/18/2023    [provider]  senna-docusate (SENOKOT-S) 8.6-50 MG tablet Take 2 tablets by mouth at bedtime as needed for mild constipation. Patient not taking: Reported on 10/18/2023 09/14/23   Erik Kieth BROCKS, MD    Family History Family History  Problem Relation Age of Onset   Asthma Sister  Social History Social History   Tobacco Use   Smoking status: Never   Smokeless tobacco: Never  Vaping Use   Vaping status: Never Used  Substance Use Topics   Alcohol use: No   Drug use: No     Allergies   Patient has no known allergies.   Review of Systems Review of Systems PER HPI  Physical Exam Triage Vital Signs ED Triage Vitals  Encounter Vitals Group     BP 03/15/24 1736 130/89     Girls Systolic BP Percentile --      Girls Diastolic BP Percentile --      Boys Systolic BP Percentile --      Boys Diastolic BP Percentile --      Pulse Rate 03/15/24 1736 95     Resp 03/15/24 1736 20     Temp 03/15/24 1736 98.3 F (36.8 C)     Temp  Source 03/15/24 1736 Oral     SpO2 03/15/24 1736 96 %     Weight --      Height --      Head Circumference --      Peak Flow --      Pain Score 03/15/24 1738 7     Pain Loc --      Pain Education --      Exclude from Growth Chart --    No data found.  Updated Vital Signs BP 130/89 (BP Location: Right Arm)   Pulse 95   Temp 98.3 F (36.8 C) (Oral)   Resp 20   SpO2 96%   Breastfeeding No   Visual Acuity Right Eye Distance:   Left Eye Distance:   Bilateral Distance:    Right Eye Near:   Left Eye Near:    Bilateral Near:     Physical Exam Vitals and nursing note reviewed.  Constitutional:      Appearance: Normal appearance. She is not ill-appearing.  HENT:     Head: Atraumatic.  Eyes:     Extraocular Movements: Extraocular movements intact.     Conjunctiva/sclera: Conjunctivae normal.  Cardiovascular:     Rate and Rhythm: Normal rate and regular rhythm.     Heart sounds: Normal heart sounds.  Pulmonary:     Effort: Pulmonary effort is normal.     Breath sounds: Normal breath sounds.  Musculoskeletal:        General: Swelling, tenderness and signs of injury present. No deformity. Normal range of motion.     Cervical back: Normal range of motion and neck supple.     Comments: Localized area of edema, tenderness to palpation to the right lateral malleolus  Skin:    General: Skin is warm and dry.     Findings: No bruising or erythema.  Neurological:     Mental Status: She is alert and oriented to person, place, and time.     Motor: No weakness.     Gait: Gait normal.     Comments: Right lower extremity neurovascularly intact  Psychiatric:        Mood and Affect: Mood normal.        Thought Content: Thought content normal.        Judgment: Judgment normal.      UC Treatments / Results  Labs (all labs ordered are listed, but only abnormal results are displayed) Labs Reviewed - No data to display  EKG   Radiology DG Ankle Complete Right Result Date:  03/15/2024 EXAM: 3 or more VIEW(S) XRAY  OF THE RIGHT ANKLE 03/15/2024 05:49:47 PM CLINICAL HISTORY: right ankle pain after fall. COMPARISON: None available. FINDINGS: BONES AND JOINTS: High suspicion for tibiotalar joint effusion. The plafond and talar dome appear intact. No malleolar fracture is readily apparent. Incidental small os peroneus. No joint dislocation. SOFT TISSUES: Soft tissue swelling over the lateral malleolus. IMPRESSION: 1. High suspicion for tibiotalar joint effusion. 2. Soft tissue swelling over the lateral malleolus. 3. No acute osseous abnormality. Electronically signed by: Ryan Salvage MD 03/15/2024 06:24 PM EDT RP Workstation: [id number]    Procedures Procedures (including critical care time)  Medications Ordered in UC Medications - No data to display  Initial Impression / Assessment and Plan / UC Course  I have reviewed the triage vital signs and the nursing notes.  Pertinent labs & imaging results that were available during my care of the patient were reviewed by me and considered in my medical decision making (see chart for details).     X-ray of the right ankle negative for acute bony abnormality.  Suspect ankle sprain.  Treat with Ace wrap, crutches, over-the-counter pain relievers, ice, elevation.  Return for worsening symptoms.  Final Clinical Impressions(s) / UC Diagnoses   Final diagnoses:  Sprain of right ankle, unspecified ligament, initial encounter   Discharge Instructions   None    ED Prescriptions   None    PDMP not reviewed this encounter.   Stuart Vernell Norris, [place] 03/15/24 1839
